# Patient Record
Sex: Female | Born: 1975
Health system: Southern US, Community
[De-identification: ages and names within clinical notes are randomized; demographics above are authoritative.]

## PROBLEM LIST (undated history)

## (undated) DIAGNOSIS — E079 Disorder of thyroid, unspecified: Secondary | ICD-10-CM

## (undated) DIAGNOSIS — Q5128 Other doubling of uterus, other specified: Secondary | ICD-10-CM

## (undated) DIAGNOSIS — Q512 Other doubling of uterus, unspecified: Secondary | ICD-10-CM

## (undated) DIAGNOSIS — Z8619 Personal history of other infectious and parasitic diseases: Secondary | ICD-10-CM

## (undated) DIAGNOSIS — R011 Cardiac murmur, unspecified: Secondary | ICD-10-CM

## (undated) HISTORY — PX: OTHER SURGICAL HISTORY: SHX169

## (undated) HISTORY — DX: Personal history of other infectious and parasitic diseases: Z86.19

## (undated) HISTORY — DX: Cardiac murmur, unspecified: R01.1

## (undated) HISTORY — DX: Disorder of thyroid, unspecified: E07.9

## (undated) HISTORY — DX: Other doubling of uterus, unspecified: Q51.20

## (undated) HISTORY — DX: Other and unspecified doubling of uterus: Q51.28

---
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
## 2001-12-02 ENCOUNTER — Other Ambulatory Visit: Admission: RE | Admit: 2001-12-02 | Discharge: 2001-12-02 | Payer: Self-pay | Admitting: Obstetrics and Gynecology

## 2002-08-19 ENCOUNTER — Emergency Department (HOSPITAL_COMMUNITY): Admission: EM | Admit: 2002-08-19 | Discharge: 2002-08-19 | Payer: Self-pay | Admitting: Emergency Medicine

## 2002-12-08 ENCOUNTER — Other Ambulatory Visit: Admission: RE | Admit: 2002-12-08 | Discharge: 2002-12-08 | Payer: Self-pay | Admitting: Obstetrics and Gynecology

## 2003-08-04 ENCOUNTER — Other Ambulatory Visit: Admission: RE | Admit: 2003-08-04 | Discharge: 2003-08-04 | Payer: Self-pay | Admitting: Obstetrics and Gynecology

## 2004-02-19 ENCOUNTER — Inpatient Hospital Stay (HOSPITAL_COMMUNITY): Admission: AD | Admit: 2004-02-19 | Discharge: 2004-02-23 | Payer: Self-pay | Admitting: Obstetrics and Gynecology

## 2004-02-20 ENCOUNTER — Encounter (INDEPENDENT_AMBULATORY_CARE_PROVIDER_SITE_OTHER): Payer: Self-pay | Admitting: *Deleted

## 2004-02-27 ENCOUNTER — Inpatient Hospital Stay (HOSPITAL_COMMUNITY): Admission: AD | Admit: 2004-02-27 | Discharge: 2004-02-27 | Payer: Self-pay | Admitting: Obstetrics and Gynecology

## 2004-03-01 ENCOUNTER — Ambulatory Visit (HOSPITAL_COMMUNITY): Admission: RE | Admit: 2004-03-01 | Discharge: 2004-03-01 | Payer: Self-pay | Admitting: Obstetrics and Gynecology

## 2004-03-28 ENCOUNTER — Other Ambulatory Visit: Admission: RE | Admit: 2004-03-28 | Discharge: 2004-03-28 | Payer: Self-pay | Admitting: Obstetrics and Gynecology

## 2005-05-25 ENCOUNTER — Other Ambulatory Visit: Admission: RE | Admit: 2005-05-25 | Discharge: 2005-05-25 | Payer: Self-pay | Admitting: Obstetrics and Gynecology

## 2005-07-03 ENCOUNTER — Ambulatory Visit: Payer: Self-pay | Admitting: Family Medicine

## 2006-08-22 ENCOUNTER — Encounter (HOSPITAL_COMMUNITY): Admission: RE | Admit: 2006-08-22 | Discharge: 2006-09-21 | Payer: Self-pay | Admitting: Endocrinology

## 2006-08-27 ENCOUNTER — Encounter (HOSPITAL_COMMUNITY): Admission: RE | Admit: 2006-08-27 | Discharge: 2006-09-26 | Payer: Self-pay | Admitting: Endocrinology

## 2007-05-30 ENCOUNTER — Encounter: Payer: Self-pay | Admitting: Family Medicine

## 2008-06-05 ENCOUNTER — Inpatient Hospital Stay (HOSPITAL_COMMUNITY): Admission: AD | Admit: 2008-06-05 | Discharge: 2008-06-05 | Payer: Self-pay | Admitting: Obstetrics and Gynecology

## 2008-06-08 ENCOUNTER — Inpatient Hospital Stay (HOSPITAL_COMMUNITY): Admission: AD | Admit: 2008-06-08 | Discharge: 2008-06-08 | Payer: Self-pay | Admitting: Obstetrics and Gynecology

## 2008-06-11 ENCOUNTER — Inpatient Hospital Stay (HOSPITAL_COMMUNITY): Admission: AD | Admit: 2008-06-11 | Discharge: 2008-06-11 | Payer: Self-pay | Admitting: Obstetrics and Gynecology

## 2008-06-20 ENCOUNTER — Emergency Department (HOSPITAL_COMMUNITY): Admission: EM | Admit: 2008-06-20 | Discharge: 2008-06-20 | Payer: Self-pay | Admitting: Family Medicine

## 2009-05-26 ENCOUNTER — Inpatient Hospital Stay (HOSPITAL_COMMUNITY): Admission: AD | Admit: 2009-05-26 | Discharge: 2009-05-30 | Payer: Self-pay | Admitting: Obstetrics and Gynecology

## 2009-05-27 ENCOUNTER — Encounter (INDEPENDENT_AMBULATORY_CARE_PROVIDER_SITE_OTHER): Payer: Self-pay | Admitting: Obstetrics and Gynecology

## 2010-06-19 ENCOUNTER — Encounter: Payer: Self-pay | Admitting: Endocrinology

## 2010-06-28 NOTE — Letter (Signed)
Summary: rpc chart  rpc chart   Imported By: Curtis Sites 03/07/2010 11:36:40  _____________________________________________________________________  External Attachment:    Type:   Image     Comment:   External Document

## 2010-08-14 LAB — CBC
HCT: 18.4 % — ABNORMAL LOW (ref 36.0–46.0)
Hemoglobin: 6.4 g/dL — CL (ref 12.0–15.0)
MCHC: 34.6 g/dL (ref 30.0–36.0)
MCV: 100.6 fL — ABNORMAL HIGH (ref 78.0–100.0)
Platelets: 187 10*3/uL (ref 150–400)
RBC: 1.83 MIL/uL — ABNORMAL LOW (ref 3.87–5.11)
RDW: 14.1 % (ref 11.5–15.5)
WBC: 9.5 10*3/uL (ref 4.0–10.5)

## 2010-08-29 LAB — CBC
HCT: 18.1 % — ABNORMAL LOW (ref 36.0–46.0)
HCT: 21 % — ABNORMAL LOW (ref 36.0–46.0)
HCT: 36.3 % (ref 36.0–46.0)
Hemoglobin: 12.6 g/dL (ref 12.0–15.0)
Hemoglobin: 6.4 g/dL — CL (ref 12.0–15.0)
Hemoglobin: 7.2 g/dL — ABNORMAL LOW (ref 12.0–15.0)
MCHC: 34.1 g/dL (ref 30.0–36.0)
MCHC: 34.7 g/dL (ref 30.0–36.0)
MCHC: 35.2 g/dL (ref 30.0–36.0)
MCV: 100 fL (ref 78.0–100.0)
MCV: 101 fL — ABNORMAL HIGH (ref 78.0–100.0)
MCV: 99.5 fL (ref 78.0–100.0)
Platelets: 161 10*3/uL (ref 150–400)
Platelets: 214 10*3/uL (ref 150–400)
Platelets: 234 10*3/uL (ref 150–400)
RBC: 1.82 MIL/uL — ABNORMAL LOW (ref 3.87–5.11)
RBC: 2.08 MIL/uL — ABNORMAL LOW (ref 3.87–5.11)
RBC: 3.65 MIL/uL — ABNORMAL LOW (ref 3.87–5.11)
RDW: 13.4 % (ref 11.5–15.5)
RDW: 13.6 % (ref 11.5–15.5)
RDW: 13.7 % (ref 11.5–15.5)
WBC: 10.7 10*3/uL — ABNORMAL HIGH (ref 4.0–10.5)
WBC: 10.8 10*3/uL — ABNORMAL HIGH (ref 4.0–10.5)
WBC: 14.2 10*3/uL — ABNORMAL HIGH (ref 4.0–10.5)

## 2010-08-29 LAB — RPR: RPR Ser Ql: NONREACTIVE

## 2010-09-12 LAB — DIFFERENTIAL
Basophils Absolute: 0 10*3/uL (ref 0.0–0.1)
Basophils Relative: 0 % (ref 0–1)
Eosinophils Absolute: 0.1 10*3/uL (ref 0.0–0.7)
Eosinophils Relative: 1 % (ref 0–5)
Lymphocytes Relative: 31 % (ref 12–46)
Lymphs Abs: 2.1 10*3/uL (ref 0.7–4.0)
Monocytes Absolute: 0.5 10*3/uL (ref 0.1–1.0)
Monocytes Relative: 7 % (ref 3–12)
Neutro Abs: 4.1 10*3/uL (ref 1.7–7.7)
Neutrophils Relative %: 60 % (ref 43–77)

## 2010-09-12 LAB — AST: AST: 20 U/L (ref 0–37)

## 2010-09-12 LAB — CBC
HCT: 38.6 % (ref 36.0–46.0)
Hemoglobin: 13.3 g/dL (ref 12.0–15.0)
MCHC: 34.5 g/dL (ref 30.0–36.0)
MCV: 94.5 fL (ref 78.0–100.0)
Platelets: 233 10*3/uL (ref 150–400)
RBC: 4.09 MIL/uL (ref 3.87–5.11)
RDW: 11.6 % (ref 11.5–15.5)
WBC: 6.8 10*3/uL (ref 4.0–10.5)

## 2010-09-12 LAB — HCG, QUANTITATIVE, PREGNANCY
hCG, Beta Chain, Quant, S: 158 m[IU]/mL — ABNORMAL HIGH (ref <5)
hCG, Beta Chain, Quant, S: 44 m[IU]/mL — ABNORMAL HIGH (ref <5)
hCG, Beta Chain, Quant, S: 571 m[IU]/mL — ABNORMAL HIGH (ref <5)

## 2010-09-12 LAB — BUN: BUN: 10 mg/dL (ref 6–23)

## 2010-09-12 LAB — CREATININE, SERUM
Creatinine, Ser: 0.68 mg/dL (ref 0.4–1.2)
GFR calc Af Amer: >60 mL/min (ref >60)
GFR calc non Af Amer: >60 mL/min (ref >60)

## 2010-10-14 NOTE — Op Note (Signed)
[name], [name]               ACCOUNT NO.:  [id number]   MEDICAL RECORD NO.:  [id number]          PATIENT TYPE:  INP   LOCATION:  9101                          FACILITY:  [facility]   PHYSICIAN:  Juluis Mire, M.D.   DATE OF BIRTH:  30-Sep-1975   DATE OF PROCEDURE:  02/20/2004  DATE OF DISCHARGE:                                 OPERATIVE REPORT   PREOPERATIVE DIAGNOSES:  1.  Intrauterine pregnancy at term.  2.  Uterus didelphys.  3.  Obstructing band of tissue over the fetal vertex leading to failure to      progress.   POSTOPERATIVE DIAGNOSES:  1.  Intrauterine pregnancy at term.  2.  Uterus didelphys.  3.  Obstructing band of tissue over the fetal vertex leading to failure to      progress.  4.  Cystotomy with repair.   PROCEDURES:  1.  Low transverse cesarean section.  2.  Cystotomy with repair.   SURGEON:  Juluis Mire, M.D.   ANESTHESIA:  Epidural.   ESTIMATED BLOOD LOSS:  400-500 mL.   PACKS AND DRAINS:  None.   INTRAOPERATIVE BLOOD REPLACED:  None.   COMPLICATIONS:  Cystotomy with repair.   INDICATIONS:  The patient is a 35 year old primigravida married white female  with known uterine didelphys.  Had a relatively uncomplicated prenatal  course, presented with spontaneous onset of labor.  Progressed to what we  felt was complete dilatation.  There was an obstructing band of tissue over  the fetal vertex that could not be reduced.  There was concern about  clipping this area not knowing exactly what it was attached to, the decision  therefore was made to proceed with primary cesarean section.  The risks were  discussed, including the risk of infection; the risk of hemorrhage that  could require transfusion; the risk of AIDS or hepatitis; the risk of injury  to adjacent organs including bladder, bowel, or ureters that could require  further exploratory surgery; risk of deep venous thrombosis and pulmonary  embolus.   PROCEDURE:  The patient was taken to the  OR and placed in the supine  position with left lateral tilt.  After a satisfactory level of epidural  anesthesia was obtained, the abdomen was prepped out with Betadine and  draped out in a sterile field.  A low transverse skin incision made with a  knife, carried through subcutaneous tissue.  Anterior rectus fascia was  identified and entered sharply and the incision in the fascia extended  laterally.  The rectus muscles were separated in the midline, the peritoneum  was entered sharply, and the incision in the peritoneum extended both  superiorly and inferiorly.  Visualizing the pelvic cavity, we could identify  the pregnant side of the uterus.  This occupied the majority of the lower  uterine segment.  A low transverse bladder flap seemed to be easily  developed.  A low transverse uterine incision was begun high on the lower  uterine segment.  The infant was delivered with elevation of the head and  fundal pressure.  The infant was a viable  female who weighed 7 pounds 3  ounces, Apgars were 8/9, and the umbilical artery pH was 7.34.  The placenta  was delivered manually.  The uterus was exteriorized for closure.  It  appeared that the pregnancy was in the right uterine horn.  There was a left  uterine cavity, although externally there was not a separation; therefore,  the uterus appeared as one but, again, there were two cavities.  It appeared  that the one on the right was the pregnant one.  We could see a little  septum that had been torn away between the two.  At this point in time we  identified both uterine edges and began closing on the left side.  We  brought this to midline, then stopped.  Then we went to the septum and  attached that; therefore, the left uterine horn continued to have a drainage  system through cervix to the vagina.  Next we started again at the left edge  and completely brought the uterine incision together.  We used a second  stitch, completely imbricating the  first.  With this we had excellent  hemostasis and it appeared adequate approximation of the uterus.  At this  point in time gross hematuria was noted.  Visualizing the bladder, there was  an approximately 3 cm tear at the bladder fundus.  We examined the bladder.  There were no other tears or problems, and this was well from the trigone.  We identified each edge, brought the cystotomy site together with a running  suture of 3-0 chromic.  We then did a second layer of 3-0 chromic  imbricating the first and a third layer using 3-0 Vicryl, imbricating all  the other two layers.  The urine was clearing at this point.  We thoroughly  irrigated the pelvis.  We had good hemostasis.  The uterus was then returned  to the abdominal cavity.  Again tubes and ovaries were unremarkable.  At  this point in time muscles and peritoneum closed in running suture with 3-0  Vicryl, fascia closed with running suture of 3-0 PDS.  The skin was closed  with staples and Steri-Strips.  Sponge, instrument, and needle count  reported as correct by circulating nurse x2.  The Foley catheter was  clearing at the time of closure.  The patient tolerated the procedure well  and was returned to the recovery room in good condition.      JSM/MEDQ  D:  02/20/2004  T:  02/22/2004  Job:  [id number]

## 2010-10-14 NOTE — Discharge Summary (Signed)
[name], [name]               ACCOUNT NO.:  [id number]   MEDICAL RECORD NO.:  [id number]          PATIENT TYPE:  INP   LOCATION:  9101                          FACILITY:  [facility]   PHYSICIAN:  Duke Salvia. Marcelle Overlie, M.D.DATE OF BIRTH:  [birth year]   DATE OF ADMISSION:  02/19/2004  DATE OF DISCHARGE:  02/23/2004                                 DISCHARGE SUMMARY   ADMITTING DIAGNOSES:  1.  Intrauterine pregnancy at term.  2.  Uterine didelphys.  3.  Spontaneous onset of labor.   DISCHARGE DIAGNOSES:  1.  Status post low transverse cesarean section secondary to failure to      progress.  2.  Viable female infant.  3.  Cystotomy with repair.   PROCEDURE:  1.  Primary low transverse cesarean section.  2.  Cystotomy with repair.   REASON FOR ADMISSION:  Please see dictated H&P.   HOSPITAL COURSE:  The patient was a 35 year old primigravida white married  female that was admitted to [facility] with spontaneous  onset of labor.  The patient had known uterine didelphys.  Her pregnancy  course had been relatively uncomplicated.  The patient did progress to  complete dilatation.  There was an obstructing band of tissue that was noted  over the fetal vertex that could not be reduced.  There was some concern  regarding excision of this area not knowing exactly what it was attached to,  and the decision was made to proceed with a primary cesarean delivery.  The  patient was then transferred to the operating room where epidural was dosed  to an adequate surgical level.  A low transverse incision was made with the  delivery of a viable female infant weighing 7 pounds 3 ounces with Apgars of 8  at one minute and 9 at five minutes.  Umbilical cord pH was 7.34.  The  placenta was delivered manually and the uterus was exteriorized for closure.  The pregnancy appeared to be from the right horn of the uterus.  At the time  of the closure, good hemostasis was performed from the uterus  and was noted  to have some hematuria at that time, and the bladder was noted to have a 3  cm tear at the bladder fundus.  Cystotomy site was repaired with 3-0  chromic.  Good hemostasis was obtained.  The uterus was then returned to the  abdominal cavity.  Tubes and ovaries were unremarkable.  Normal closure was  done and the patient was transferred to the recovery room in good condition.  On postoperative day #1, vital signs were stable, she was afebrile.  Fundus  was firm and nontender.  Output was adequate without hematuria.  Labs  revealed hemoglobin of 9.7.  Foley remained in place.  On postoperative day  #2, the patient did complain of passing a large amount of tissue.  Vital  signs were stable, she was afebrile.  Abdomen was soft with good return of  bowel function.  Fundus was firm with small amount of lochia.  Foley  continued with adequate output with clear urine.  Tissue  was sent for  pathology.  On postoperative day #3, the patient was without complaint.  Vital signs were stable, she was afebrile.  Abdomen soft, fundus was firm  and nontender.  Incision was clean, dry, and intact.  Staples were removed.  The Foley was connected to a leg bag and the patient was discharged home.   CONDITION ON DISCHARGE:  Good.   DIET:  Regular as tolerated.   ACTIVITY:  No heavy lifting, no driving x2 weeks, no vaginal entry.   FOLLOW-UP:  The patient is to follow up in the office in 1 week for an  incision check.  She is also to be scheduled for a cystogram in  approximately 10 days.  She is to call for temperature greater than 100  degrees, persistent nausea and vomiting, heavy vaginal bleeding, and/or  redness or drainage from the incisional site.   DISCHARGE MEDICATIONS:  1.  Tylox #30 one p.o. q.4-6h. p.r.n.  2.  Motrin 600 mg q.6h. p.r.n.  3.  Prenatal vitamins one p.o. daily.     Caro   CC/MEDQ  D:  03/15/2004  T:  03/15/2004  Job:  [id number]

## 2011-02-23 ENCOUNTER — Encounter (HOSPITAL_COMMUNITY): Payer: Self-pay | Admitting: Anesthesiology

## 2011-02-23 ENCOUNTER — Inpatient Hospital Stay (HOSPITAL_COMMUNITY): Payer: 59 | Admitting: Anesthesiology

## 2011-02-23 ENCOUNTER — Inpatient Hospital Stay (HOSPITAL_COMMUNITY): Admission: RE | Admit: 2011-02-23 | Payer: 59 | Source: Ambulatory Visit

## 2011-02-23 ENCOUNTER — Encounter (HOSPITAL_COMMUNITY): Payer: Self-pay | Admitting: *Deleted

## 2011-02-23 ENCOUNTER — Inpatient Hospital Stay (HOSPITAL_COMMUNITY)
Admission: AD | Admit: 2011-02-23 | Discharge: 2011-02-25 | DRG: 766 | Disposition: A | Discharge status: Home / Self Care | Payer: 59 | Source: Ambulatory Visit | Attending: Obstetrics and Gynecology | Admitting: Obstetrics and Gynecology

## 2011-02-23 ENCOUNTER — Encounter (HOSPITAL_COMMUNITY)
Admission: AD | Disposition: A | Discharge status: Home / Self Care | Payer: Self-pay | Source: Ambulatory Visit | Attending: Obstetrics and Gynecology

## 2011-02-23 ENCOUNTER — Other Ambulatory Visit: Payer: Self-pay | Admitting: Obstetrics and Gynecology

## 2011-02-23 DIAGNOSIS — O34219 Maternal care for unspecified type scar from previous cesarean delivery: Secondary | ICD-10-CM

## 2011-02-23 DIAGNOSIS — Z9851 Tubal ligation status: Secondary | ICD-10-CM

## 2011-02-23 DIAGNOSIS — O09529 Supervision of elderly multigravida, unspecified trimester: Secondary | ICD-10-CM | POA: Diagnosis present

## 2011-02-23 DIAGNOSIS — Z302 Encounter for sterilization: Secondary | ICD-10-CM

## 2011-02-23 LAB — CBC
HCT: 36.2 % (ref 36.0–46.0)
Hemoglobin: 13 g/dL (ref 12.0–15.0)
MCH: 33.6 pg (ref 26.0–34.0)
MCHC: 35.9 g/dL (ref 30.0–36.0)
MCV: 93.5 fL (ref 78.0–100.0)
Platelets: 217 10*3/uL (ref 150–400)
RBC: 3.87 MIL/uL (ref 3.87–5.11)
RDW: 12.7 % (ref 11.5–15.5)
WBC: 9.6 10*3/uL (ref 4.0–10.5)

## 2011-02-23 LAB — RPR: RPR Ser Ql: NONREACTIVE

## 2011-02-23 LAB — STREP B DNA PROBE: GBS: POSITIVE

## 2011-02-23 SURGERY — Surgical Case
Anesthesia: Spinal | Site: Abdomen | Wound class: Clean Contaminated

## 2011-02-23 MED ORDER — ONDANSETRON HCL 4 MG/2ML IJ SOLN: 4.0000 mg | INTRAMUSCULAR | Status: DC | PRN

## 2011-02-23 MED ORDER — PHENYLEPHRINE 40 MCG/ML (10ML) SYRINGE FOR IV PUSH (FOR BLOOD PRESSURE SUPPORT)
PREFILLED_SYRINGE | INTRAVENOUS | Status: AC
  Filled 2011-02-23: qty 5

## 2011-02-23 MED ORDER — LIOTHYRONINE SODIUM 25 MCG PO TABS
12.5000 ug | ORAL_TABLET | Freq: Every day | ORAL | Status: DC
  Administered 2011-02-23: 25 ug via ORAL
  Administered 2011-02-24: 12.5 ug via ORAL
  Filled 2011-02-23 (×3): qty 1

## 2011-02-23 MED ORDER — OXYTOCIN 10 UNIT/ML IJ SOLN
INTRAMUSCULAR | Status: AC
  Filled 2011-02-23: qty 2

## 2011-02-23 MED ORDER — HYDROMORPHONE HCL 1 MG/ML IJ SOLN: 0.2500 mg | INTRAMUSCULAR | Status: DC | PRN

## 2011-02-23 MED ORDER — CITRIC ACID-SODIUM CITRATE 334-500 MG/5ML PO SOLN: 30.0000 mL | Freq: Once | ORAL | Status: DC

## 2011-02-23 MED ORDER — LEVOTHYROXINE SODIUM 125 MCG PO TABS
125.0000 ug | ORAL_TABLET | Freq: Every day | ORAL | Status: DC
  Administered 2011-02-23 – 2011-02-24 (×2): 125 ug via ORAL
  Filled 2011-02-23 (×3): qty 1

## 2011-02-23 MED ORDER — MEDROXYPROGESTERONE ACETATE 150 MG/ML IM SUSP: 150.0000 mg | INTRAMUSCULAR | Status: DC | PRN

## 2011-02-23 MED ORDER — IBUPROFEN 600 MG PO TABS
600.0000 mg | ORAL_TABLET | Freq: Four times a day (QID) | ORAL | Status: DC
  Administered 2011-02-23 – 2011-02-25 (×7): 600 mg via ORAL
  Filled 2011-02-23 (×3): qty 1

## 2011-02-23 MED ORDER — KETOROLAC TROMETHAMINE 60 MG/2ML IM SOLN
INTRAMUSCULAR | Status: AC
  Filled 2011-02-23: qty 2

## 2011-02-23 MED ORDER — KETOROLAC TROMETHAMINE 60 MG/2ML IM SOLN
60.0000 mg | Freq: Once | INTRAMUSCULAR | Status: AC | PRN
  Administered 2011-02-23: 60 mg via INTRAMUSCULAR

## 2011-02-23 MED ORDER — ONDANSETRON HCL 4 MG PO TABS: 4.0000 mg | ORAL_TABLET | ORAL | Status: DC | PRN

## 2011-02-23 MED ORDER — DIPHENHYDRAMINE HCL 25 MG PO CAPS: 25.0000 mg | ORAL_CAPSULE | ORAL | Status: DC | PRN

## 2011-02-23 MED ORDER — TERBUTALINE SULFATE 1 MG/ML IJ SOLN
0.2500 mg | Freq: Once | INTRAMUSCULAR | Status: AC
  Administered 2011-02-23: 0.25 mg via SUBCUTANEOUS

## 2011-02-23 MED ORDER — LACTATED RINGERS IV SOLN
INTRAVENOUS | Status: DC
  Administered 2011-02-23 (×4): via INTRAVENOUS

## 2011-02-23 MED ORDER — DIPHENHYDRAMINE HCL 50 MG/ML IJ SOLN: 25.0000 mg | INTRAMUSCULAR | Status: DC | PRN

## 2011-02-23 MED ORDER — OXYTOCIN 20 UNITS IN LACTATED RINGERS INFUSION - SIMPLE: 125.0000 mL/h | INTRAVENOUS | Status: AC

## 2011-02-23 MED ORDER — SCOPOLAMINE 1 MG/3DAYS TD PT72
1.0000 | MEDICATED_PATCH | Freq: Once | TRANSDERMAL | Status: DC
  Administered 2011-02-23: 1.5 mg via TRANSDERMAL

## 2011-02-23 MED ORDER — FAMOTIDINE IN NACL 20-0.9 MG/50ML-% IV SOLN: 20.0000 mg | Freq: Once | INTRAVENOUS | Status: DC

## 2011-02-23 MED ORDER — WITCH HAZEL-GLYCERIN EX PADS: 1.0000 "application " | MEDICATED_PAD | CUTANEOUS | Status: DC | PRN

## 2011-02-23 MED ORDER — SENNOSIDES-DOCUSATE SODIUM 8.6-50 MG PO TABS
2.0000 | ORAL_TABLET | Freq: Every day | ORAL | Status: DC
  Administered 2011-02-24: 2 via ORAL

## 2011-02-23 MED ORDER — NALBUPHINE HCL 10 MG/ML IJ SOLN
5.0000 mg | INTRAMUSCULAR | Status: DC | PRN
  Filled 2011-02-23: qty 1

## 2011-02-23 MED ORDER — FENTANYL CITRATE 0.05 MG/ML IJ SOLN
INTRAMUSCULAR | Status: DC | PRN
  Administered 2011-02-23: 87.5 ug via INTRAVENOUS

## 2011-02-23 MED ORDER — KETOROLAC TROMETHAMINE 30 MG/ML IJ SOLN: 30.0000 mg | Freq: Four times a day (QID) | INTRAMUSCULAR | Status: AC | PRN

## 2011-02-23 MED ORDER — OXYCODONE-ACETAMINOPHEN 5-325 MG PO TABS
1.0000 | ORAL_TABLET | ORAL | Status: DC | PRN
  Administered 2011-02-24 – 2011-02-25 (×4): 1 via ORAL
  Filled 2011-02-23 (×4): qty 1

## 2011-02-23 MED ORDER — FENTANYL CITRATE 0.05 MG/ML IJ SOLN
INTRAMUSCULAR | Status: AC
  Filled 2011-02-23: qty 2

## 2011-02-23 MED ORDER — KETOROLAC TROMETHAMINE 30 MG/ML IJ SOLN
30.0000 mg | Freq: Four times a day (QID) | INTRAMUSCULAR | Status: AC | PRN
  Administered 2011-02-23: 30 mg via INTRAMUSCULAR
  Filled 2011-02-23: qty 1

## 2011-02-23 MED ORDER — TETANUS-DIPHTH-ACELL PERTUSSIS 5-2.5-18.5 LF-MCG/0.5 IM SUSP
0.5000 mL | Freq: Once | INTRAMUSCULAR | Status: AC
  Administered 2011-02-24: 0.5 mL via INTRAMUSCULAR
  Filled 2011-02-23: qty 0.5

## 2011-02-23 MED ORDER — ONDANSETRON HCL 4 MG/2ML IJ SOLN: 4.0000 mg | Freq: Three times a day (TID) | INTRAMUSCULAR | Status: DC | PRN

## 2011-02-23 MED ORDER — SODIUM CHLORIDE 0.9 % IJ SOLN: 3.0000 mL | INTRAMUSCULAR | Status: DC | PRN

## 2011-02-23 MED ORDER — SCOPOLAMINE 1 MG/3DAYS TD PT72
MEDICATED_PATCH | TRANSDERMAL | Status: AC
  Filled 2011-02-23: qty 1

## 2011-02-23 MED ORDER — PRENATAL PLUS 27-1 MG PO TABS
1.0000 | ORAL_TABLET | Freq: Every day | ORAL | Status: DC
  Administered 2011-02-23 – 2011-02-25 (×3): 1 via ORAL
  Filled 2011-02-23 (×3): qty 1

## 2011-02-23 MED ORDER — SIMETHICONE 80 MG PO CHEW: 80.0000 mg | CHEWABLE_TABLET | ORAL | Status: DC | PRN

## 2011-02-23 MED ORDER — DEXTROSE IN LACTATED RINGERS 5 % IV SOLN: INTRAVENOUS | Status: DC

## 2011-02-23 MED ORDER — DIBUCAINE 1 % RE OINT: 1.0000 "application " | TOPICAL_OINTMENT | RECTAL | Status: DC | PRN

## 2011-02-23 MED ORDER — NALOXONE HCL 0.4 MG/ML IJ SOLN: 0.4000 mg | INTRAMUSCULAR | Status: DC | PRN

## 2011-02-23 MED ORDER — MORPHINE SULFATE (PF) 0.5 MG/ML IJ SOLN
INTRAMUSCULAR | Status: DC | PRN
  Administered 2011-02-23: .1 ug via INTRATHECAL

## 2011-02-23 MED ORDER — METOCLOPRAMIDE HCL 5 MG/ML IJ SOLN: 10.0000 mg | Freq: Three times a day (TID) | INTRAMUSCULAR | Status: DC | PRN

## 2011-02-23 MED ORDER — MORPHINE SULFATE 0.5 MG/ML IJ SOLN
INTRAMUSCULAR | Status: AC
  Filled 2011-02-23: qty 10

## 2011-02-23 MED ORDER — MEPERIDINE HCL 25 MG/ML IJ SOLN: 6.2500 mg | INTRAMUSCULAR | Status: DC | PRN

## 2011-02-23 MED ORDER — ONDANSETRON HCL 4 MG/2ML IJ SOLN
INTRAMUSCULAR | Status: AC
  Filled 2011-02-23: qty 2

## 2011-02-23 MED ORDER — DIPHENHYDRAMINE HCL 25 MG PO CAPS: 25.0000 mg | ORAL_CAPSULE | Freq: Four times a day (QID) | ORAL | Status: DC | PRN

## 2011-02-23 MED ORDER — MENTHOL 3 MG MT LOZG: 1.0000 | LOZENGE | OROMUCOSAL | Status: DC | PRN

## 2011-02-23 MED ORDER — BUPIVACAINE IN DEXTROSE 0.75-8.25 % IT SOLN
INTRATHECAL | Status: DC | PRN
  Administered 2011-02-23: 1.5 mL via INTRATHECAL

## 2011-02-23 MED ORDER — SIMETHICONE 80 MG PO CHEW
80.0000 mg | CHEWABLE_TABLET | Freq: Three times a day (TID) | ORAL | Status: DC
  Administered 2011-02-23 – 2011-02-25 (×8): 80 mg via ORAL

## 2011-02-23 MED ORDER — ONDANSETRON HCL 4 MG/2ML IJ SOLN
INTRAMUSCULAR | Status: DC | PRN
  Administered 2011-02-23: 4 mg via INTRAVENOUS

## 2011-02-23 MED ORDER — CEFAZOLIN SODIUM-DEXTROSE 1-4 GM-% IV SOLR
1.0000 g | Freq: Once | INTRAVENOUS | Status: AC
  Administered 2011-02-23: 1 g via INTRAVENOUS
  Filled 2011-02-23: qty 50

## 2011-02-23 MED ORDER — FENTANYL CITRATE 0.05 MG/ML IJ SOLN
INTRAMUSCULAR | Status: DC | PRN
  Administered 2011-02-23: 15 ug via INTRATHECAL

## 2011-02-23 MED ORDER — FAMOTIDINE 20 MG PO TABS
20.0000 mg | ORAL_TABLET | Freq: Two times a day (BID) | ORAL | Status: DC
  Filled 2011-02-23: qty 1

## 2011-02-23 MED ORDER — LANOLIN HYDROUS EX OINT: 1.0000 "application " | TOPICAL_OINTMENT | CUTANEOUS | Status: DC | PRN

## 2011-02-23 MED ORDER — IBUPROFEN 600 MG PO TABS
600.0000 mg | ORAL_TABLET | Freq: Four times a day (QID) | ORAL | Status: DC | PRN
  Filled 2011-02-23 (×3): qty 1

## 2011-02-23 MED ORDER — CHLOROPROCAINE HCL 3 % IJ SOLN
INTRAMUSCULAR | Status: AC
  Filled 2011-02-23: qty 20

## 2011-02-23 MED ORDER — SODIUM CHLORIDE 0.9 % IV SOLN
1.0000 ug/kg/h | INTRAVENOUS | Status: DC | PRN
  Filled 2011-02-23: qty 2.5

## 2011-02-23 MED ORDER — DIPHENHYDRAMINE HCL 50 MG/ML IJ SOLN: 12.5000 mg | INTRAMUSCULAR | Status: DC | PRN

## 2011-02-23 MED ORDER — OXYTOCIN 20 UNITS IN LACTATED RINGERS INFUSION - SIMPLE
INTRAVENOUS | Status: DC | PRN
  Administered 2011-02-23 (×2): 20 [IU] via INTRAVENOUS

## 2011-02-23 MED ORDER — TERBUTALINE SULFATE 1 MG/ML IJ SOLN
INTRAMUSCULAR | Status: AC
  Filled 2011-02-23: qty 1

## 2011-02-23 MED ORDER — EPHEDRINE SULFATE 50 MG/ML IJ SOLN
INTRAMUSCULAR | Status: DC | PRN
  Administered 2011-02-23 (×2): 10 mg via INTRAVENOUS
  Administered 2011-02-23: 5 mg via INTRAVENOUS

## 2011-02-23 MED ORDER — MEASLES, MUMPS & RUBELLA VAC ~~LOC~~ INJ
0.5000 mL | INJECTION | Freq: Once | SUBCUTANEOUS | Status: DC
  Filled 2011-02-23: qty 0.5

## 2011-02-23 MED ORDER — CITRIC ACID-SODIUM CITRATE 334-500 MG/5ML PO SOLN
ORAL | Status: AC
  Filled 2011-02-23: qty 15

## 2011-02-23 MED ORDER — CHLOROPROCAINE HCL 3 % IJ SOLN
INTRAMUSCULAR | Status: DC | PRN
  Administered 2011-02-23: 20 mL

## 2011-02-23 MED ORDER — EPHEDRINE 5 MG/ML INJ
INTRAVENOUS | Status: AC
  Filled 2011-02-23: qty 10

## 2011-02-23 SURGICAL SUPPLY — 25 items
CHLORAPREP W/TINT 26ML (MISCELLANEOUS) ×2 IMPLANT
CLOTH BEACON ORANGE TIMEOUT ST (SAFETY) ×2 IMPLANT
DRSG COVADERM 4X8 (GAUZE/BANDAGES/DRESSINGS) ×2 IMPLANT
ELECT REM PT RETURN 9FT ADLT (ELECTROSURGICAL) ×2
ELECTRODE REM PT RTRN 9FT ADLT (ELECTROSURGICAL) ×1 IMPLANT
EXTRACTOR VACUUM M CUP 4 TUBE (SUCTIONS) IMPLANT
GLOVE BIO SURGEON STRL SZ 6.5 (GLOVE) ×2 IMPLANT
GLOVE BIOGEL PI IND STRL 7.0 (GLOVE) ×2 IMPLANT
GLOVE BIOGEL PI INDICATOR 7.0 (GLOVE) ×2
GOWN PREVENTION PLUS LG XLONG (DISPOSABLE) ×6 IMPLANT
KIT ABG SYR 3ML LUER SLIP (SYRINGE) ×2 IMPLANT
NEEDLE HYPO 25X5/8 SAFETYGLIDE (NEEDLE) ×2 IMPLANT
NS IRRIG 1000ML POUR BTL (IV SOLUTION) ×2 IMPLANT
PACK C SECTION WH (CUSTOM PROCEDURE TRAY) ×2 IMPLANT
SLEEVE SCD COMPRESS KNEE MED (MISCELLANEOUS) IMPLANT
STAPLER VISISTAT 35W (STAPLE) IMPLANT
SUT CHROMIC 0 CT 802H (SUTURE) IMPLANT
SUT CHROMIC 0 CTX 36 (SUTURE) ×6 IMPLANT
SUT MNCRL AB 3-0 PS2 27 (SUTURE) IMPLANT
SUT MON AB-0 CT1 36 (SUTURE) ×2 IMPLANT
SUT PDS AB 0 CTX 60 (SUTURE) ×2 IMPLANT
SUT PLAIN 0 NONE (SUTURE) IMPLANT
TOWEL OR 17X24 6PK STRL BLUE (TOWEL DISPOSABLE) ×4 IMPLANT
TRAY FOLEY CATH 14FR (SET/KITS/TRAYS/PACK) IMPLANT
WATER STERILE IRR 1000ML POUR (IV SOLUTION) ×2 IMPLANT

## 2011-02-23 NOTE — Progress Notes (Signed)
PT SAYS SHE WAS IN BED AT 0245   -  SROM- CLEAR.   NOW IN TRIAGE- NO PAD  - NO FLUID- EVEN WITH UC.Marland Kitchen

## 2011-02-23 NOTE — Anesthesia Preprocedure Evaluation (Signed)
Anesthesia Evaluation Anesthesia Physical Anesthesia Plan Anesthesia Quick Evaluation  

## 2011-02-23 NOTE — Anesthesia Postprocedure Evaluation (Signed)
Anesthesia Post Note  Patient: [name]  Procedure(s) Performed:  CESAREAN SECTION  Anesthesia type: Spinal  Patient location: PACU  Post pain: Pain level controlled  Post assessment: Post-op Vital signs reviewed  Last Vitals:  Filed Vitals:   02/23/11 0645  BP: 96/62  Pulse: 96  Temp: 97.5 F (36.4 C)  Resp: 25    Post vital signs: Reviewed  Level of consciousness: awake  Complications: No apparent anesthesia complications

## 2011-02-23 NOTE — H&P (Signed)
34yo G4P2 @ 38+3 wks presents w/ ROM and regular, painful ctx.  Pregnancy uncomplicated.  Past history - See hollister   Prior c-section x 2 w/ cystotomy   Uterine didelphys  AF, VSS Gen - NAD Abd - gravid, NT PV - + pool, + ROM  A/P:  Plan for repeat c-section w/ BTL R/B/A d/w pt, informed consent obtained

## 2011-02-23 NOTE — Transfer of Care (Signed)
Immediate Anesthesia Transfer of Care Note  Patient: [name]  Procedure(s) Performed:  CESAREAN SECTION  Patient Location: PACU  Anesthesia Type: Spinal  Level of Consciousness: awake, alert  and oriented  Airway & Oxygen Therapy: Patient Spontanous Breathing  Post-op Assessment: Report given to PACU RN and Post -op Vital signs reviewed and stable  Post vital signs: Reviewed and stable  Complications: No apparent anesthesia complications

## 2011-02-23 NOTE — Progress Notes (Signed)
2 CM ON TUESDAY

## 2011-02-23 NOTE — Anesthesia Procedure Notes (Signed)
Spinal Block  Patient location during procedure: OR Start time: 02/23/2011 4:38 AM Staffing Performed by: anesthesiologist  Preanesthetic Checklist Completed: patient identified, site marked, surgical consent, pre-op evaluation, timeout performed, IV checked, risks and benefits discussed and monitors and equipment checked Spinal Block Patient position: sitting Prep: DuraPrep Patient monitoring: heart rate, cardiac monitor, continuous pulse ox and blood pressure Approach: midline Location: L3-4 Injection technique: single-shot Needle Needle type: Sprotte  Needle gauge: 24 G Needle length: 9 cm Assessment Sensory level: T4

## 2011-02-23 NOTE — Progress Notes (Addendum)
Cesarean Section Procedure Note   [name]  02/23/2011  Indications: ROM, prior c-section x 2   Pre-operative Diagnosis: Previous C-Section; Laboring.   Post-operative Diagnosis: Same   Procedure:  Repeat LTCD, BTL  Surgeon: Surgeon(s) and Role:    * Zelphia Cairo - Primary   Assistants: none  Anesthesia: spinal   Procedure Details:  The patient was seen in the Holding Room. The risks, benefits, complications, treatment options, and expected outcomes were discussed with the patient. The patient concurred with the proposed plan, giving informed consent. identified as Durenda Age and the procedure verified as C-Section Delivery. A Time Out was held and the above information confirmed.  After induction of anesthesia, the patient was draped and prepped in the usual sterile manner. A transverse was made and carried down through the subcutaneous tissue to the fascia. Fascial incision was made and extended transversely. The fascia was separated from the underlying rectus tissue superiorly and inferiorly. The peritoneum was identified and entered. Peritoneal incision was extended longitudinally. The utero-vesical peritoneal reflection was incised transversely and the bladder flap was bluntly freed from the lower uterine segment. A low transverse uterine incision was made. Delivered from breech (footling) presentation was a viable female infant with Apgar scores of 8 at one minute and 8 at five minutes. Cord ph was sent the umbilical cord was clamped and cut cord blood was obtained for evaluation. The placenta was removed Intact and appeared normal. The uterine outline, tubes and ovaries appeared abnormal - uterine didelphys}. The uterine incision was closed with running locked sutures of 0chromic gut.   Hemostasis was observed. Lavage was carried out until clear.  Left fallopian tube was grasped with a babcock clamp and the mesosalpinx was incised.  Plain gut suture was used to doubly tie  off a knuckle of fallopian tube.  This area of the tube was then excised and passed off to be sent to pathology.  Procedure was repeated on the right fallopian tube.  Hemostasis was observed bilaterally.  Pelvis was irrigated and all incisions were re-inspected and found to be hemostatic. The fascia was then reapproximated with running sutures of 0PDS.  The skin was closed with staples.   Instrument, sponge, and needle counts were correct prior the abdominal closure and were correct at the conclusion of the case.    Estimated Blood Loss: 500cc   Urine Output: clear  Specimens:  Specimens    None       Complications: no complications  Disposition: PACU - hemodynamically stable.   Maternal Condition: stable   Baby condition / location:  nursery-stable  Attending Attestation: I was present and scrubbed for the entire procedure.   Signed: Surgeon(s): Zelphia Cairo

## 2011-02-23 NOTE — Anesthesia Preprocedure Evaluation (Signed)
Anesthesia Evaluation  Name, MR# and DOB Patient awake  General Assessment Comment  Reviewed: Allergy & Precautions, H&P , NPO status , Patient's Chart, lab work & pertinent test results, reviewed documented beta blocker date and time   History of Anesthesia Complications Negative for: history of anesthetic complications  Airway Mallampati: II TM Distance: >3 FB Neck ROM: full    Dental  (+) Teeth Intact   Pulmonary  clear to auscultation  breath sounds clear to auscultation none    Cardiovascular regular Normal    Neuro/Psych Negative Neurological ROS  Negative Psych ROS  GI/Hepatic/Renal   negative Liver ROS  negative Renal ROS   GERD Medicated     Endo/Other  (+) Hypothyroidism,      Abdominal   Musculoskeletal   Hematology negative hematology ROS (+)   Peds  Reproductive/Obstetrics (+) Pregnancy    Anesthesia Other Findings             Anesthesia Physical Anesthesia Plan  ASA: II and Emergent  Anesthesia Plan: Spinal   Post-op Pain Management:    Induction:   Airway Management Planned:   Additional Equipment:   Intra-op Plan:   Post-operative Plan:   Informed Consent: I have reviewed the patients History and Physical, chart, labs and discussed the procedure including the risks, benefits and alternatives for the proposed anesthesia with the patient or authorized representative who has indicated his/her understanding and acceptance.     Plan Discussed with: CRNA and Surgeon  Anesthesia Plan Comments:         Anesthesia Quick Evaluation

## 2011-02-24 LAB — CBC
MCH: 33.1 pg (ref 26.0–34.0)
MCHC: 34.6 g/dL (ref 30.0–36.0)
MCV: 95.6 fL (ref 78.0–100.0)
Platelets: 181 10*3/uL (ref 150–400)
RDW: 13.1 % (ref 11.5–15.5)

## 2011-02-24 MED ORDER — INFLUENZA VIRUS VACC SPLIT PF IM SUSP
0.5000 mL | Freq: Once | INTRAMUSCULAR | Status: AC
  Administered 2011-02-24: 0.5 mL via INTRAMUSCULAR
  Filled 2011-02-24: qty 0.5

## 2011-02-24 NOTE — Progress Notes (Signed)
Subjective: Postpartum Day one: Cesarean Delivery Patient reports tolerating PO.    Objective: Vital signs in last 24 hours: Temp:  [97.7 F (36.5 C)-98.5 F (36.9 C)] 97.8 F (36.6 C) (09/28 0400) Pulse Rate:  [62-81] 70  (09/28 0400) Resp:  [16-20] 18  (09/28 0400) BP: (84-118)/(48-70) 84/53 mmHg (09/28 0400) SpO2:  [98 %-100 %] 99 % (09/28 0400)  Physical Exam:  General: alert Lochia: appropriate Uterine Fundus: firm Incision: dressing in place no active bleeding noted DVT Evaluation: No evidence of DVT seen on physical exam.   Basename 02/24/11 0605 02/23/11 0400  HGB 9.0* 13.0  HCT 26.0* 36.2    Assessment/Plan: Status post Cesarean section. Doing well postoperatively.  Continue current care.  [name] S 02/24/2011, 8:24 AM

## 2011-02-24 NOTE — Anesthesia Postprocedure Evaluation (Signed)
  Anesthesia Post-op Note  Patient: [name]  Procedure(s) Performed:  CESAREAN SECTION  Patient Location: Mother/Baby  Anesthesia Type: Spinal  Level of Consciousness: awake, alert , oriented and patient cooperative  Airway and Oxygen Therapy: Patient Spontanous Breathing  Post-op Pain: none  Post-op Assessment: Post-op Vital signs reviewed, Patient's Cardiovascular Status Stable, Respiratory Function Stable, Patent Airway, No signs of Nausea or vomiting, Adequate PO intake and Pain level controlled  Post-op Vital Signs: Reviewed and stable  Complications: No apparent anesthesia complications

## 2011-02-25 MED ORDER — OXYCODONE-ACETAMINOPHEN 5-500 MG PO CAPS: 1.0000 | ORAL_CAPSULE | ORAL | Status: AC | PRN

## 2011-02-25 NOTE — Discharge Summary (Signed)
Obstetric Discharge Summary Reason for Admission: onset of labor prior cesarean section for repeat desires sterility Prenatal Procedures: none Intrapartum Procedures: cesarean: low cervical, transverse and btl Postpartum Procedures: none Complications-Operative and Postpartum: none Hemoglobin  Date Value Range Status  02/24/2011 9.0* 12.0-15.0 (g/dL) Final     DELTA CHECK NOTED     REPEATED TO VERIFY     HCT  Date Value Range Status  02/24/2011 26.0* 36.0-46.0 (%) Final    Discharge Diagnoses: Term Pregnancy-delivered  Discharge Information: Date: 02/25/2011 Activity: pelvic rest Diet: routine Medications: Percocet Condition: stable Instructions: refer to practice specific booklet Discharge to: home   Newborn Data: Live born female  Birth Weight: 7 lb 5.3 oz (3325 g) APGAR: 8, 9  Home with mother.  Kanoe Wanner S 02/25/2011, 9:06 AM

## 2011-02-25 NOTE — Progress Notes (Signed)
Subjective: Postpartum Day two: Cesarean Delivery Patient reports + flatus.    Objective: Vital signs in last 24 hours: Temp:  [97.7 F (36.5 C)-98.3 F (36.8 C)] 97.8 F (36.6 C) (09/29 0550) Pulse Rate:  [66-76] 76  (09/29 0550) Resp:  [18] 18  (09/29 0550) BP: (89-95)/(46-59) 94/58 mmHg (09/29 0550) SpO2:  [99 %] 99 % (09/28 0935)  Physical Exam:  General: alert Lochia: appropriate Uterine Fundus: firm Incision: healing well DVT Evaluation: No evidence of DVT seen on physical exam.   Basename 02/24/11 0605 02/23/11 0400  HGB 9.0* 13.0  HCT 26.0* 36.2    Assessment/Plan: Status post Cesarean section. Doing well postoperatively.  Discharge home with standard precautions and return to clinic in 4-6 weeks.  [name] S 02/25/2011, 9:05 AM

## 2011-02-27 ENCOUNTER — Encounter (HOSPITAL_COMMUNITY): Payer: Self-pay | Admitting: Obstetrics and Gynecology

## 2011-02-28 ENCOUNTER — Encounter (HOSPITAL_COMMUNITY): Admission: RE | Payer: Self-pay | Source: Ambulatory Visit

## 2011-02-28 ENCOUNTER — Inpatient Hospital Stay (HOSPITAL_COMMUNITY): Admission: RE | Admit: 2011-02-28 | Payer: 59 | Source: Ambulatory Visit | Admitting: Obstetrics and Gynecology

## 2011-02-28 SURGERY — Surgical Case
Anesthesia: Spinal

## 2011-03-17 ENCOUNTER — Encounter (HOSPITAL_COMMUNITY): Payer: Self-pay | Admitting: *Deleted

## 2014-03-30 ENCOUNTER — Encounter (HOSPITAL_COMMUNITY): Payer: Self-pay | Admitting: *Deleted

## 2016-10-30 LAB — HM PAP SMEAR: HM PAP: NORMAL

## 2016-10-31 LAB — HM MAMMOGRAPHY: HM MAMMO: ABNORMAL — AB (ref 0–4)

## 2016-11-01 ENCOUNTER — Other Ambulatory Visit: Payer: Self-pay | Admitting: Obstetrics and Gynecology

## 2016-11-01 DIAGNOSIS — R928 Other abnormal and inconclusive findings on diagnostic imaging of breast: Secondary | ICD-10-CM

## 2016-11-03 ENCOUNTER — Ambulatory Visit
Admission: RE | Admit: 2016-11-03 | Discharge: 2016-11-03 | Disposition: A | Discharge status: Home / Self Care | Payer: BLUE CROSS/BLUE SHIELD | Source: Ambulatory Visit | Attending: Obstetrics and Gynecology | Admitting: Obstetrics and Gynecology

## 2016-11-03 DIAGNOSIS — R928 Other abnormal and inconclusive findings on diagnostic imaging of breast: Secondary | ICD-10-CM

## 2016-11-03 LAB — HM MAMMOGRAPHY

## 2017-05-14 ENCOUNTER — Encounter: Payer: Self-pay | Admitting: Physician Assistant

## 2017-05-14 ENCOUNTER — Ambulatory Visit (INDEPENDENT_AMBULATORY_CARE_PROVIDER_SITE_OTHER): Payer: BLUE CROSS/BLUE SHIELD | Admitting: Physician Assistant

## 2017-05-14 ENCOUNTER — Other Ambulatory Visit: Payer: Self-pay

## 2017-05-14 VITALS — BP 98/70 | HR 61 | Temp 97.1°F | Resp 12 | Ht 63.0 in | Wt 114.0 lb

## 2017-05-14 DIAGNOSIS — E039 Hypothyroidism, unspecified: Secondary | ICD-10-CM

## 2017-05-14 DIAGNOSIS — L989 Disorder of the skin and subcutaneous tissue, unspecified: Secondary | ICD-10-CM

## 2017-05-14 LAB — TSH: TSH: 2.08 u[IU]/mL (ref 0.35–4.50)

## 2017-05-14 NOTE — Assessment & Plan Note (Signed)
Nodular lesion. Seems benign. Referral to Dermatology placed for excision.

## 2017-05-14 NOTE — Progress Notes (Signed)
Patient presents to clinic today to establish care.  Acute Concerns: Patient endorses a spot of forehead that has been present for several years. Has been changing in size over the past couple of weeks. Denies pain, pruritus, drainage. Denies personal history of skin cancer. Mother with history of BCC.   Chronic Issues: Hypothyroidism -- Followed by Dr. Sharl MaKerr (Endocrinology). Is currently on a regimen of levothyroxine 112mcg daily and liothyronine 25 mcg daily. Has been stable on this regimen for a while. Was unable to follow-up wiuth Dr. Sharl MaKerr at last scheduled time. Would like labs today if possible.  Health Maintenance: Immunizations -- immunizations are up-to-date Mammogram -- August in 12/2016. No concerning findings.  PAP -- August 2018. Negative PAP smear per patient.   Past Medical History:  Diagnosis Date  . Heart murmur   . History of chickenpox   . Thyroid disease     Past Surgical History:  Procedure Laterality Date  . CESAREAN SECTION  02/23/2011   Procedure: CESAREAN SECTION;  Surgeon: Zelphia CairoGretchen Adkins;  Location: [facility];  Service: Gynecology;  Laterality: N/A;  . thyroid radiation  2007/2008  . TONSILLECTOMY AND ADENOIDECTOMY  1982    Current Outpatient Medications on File Prior to Visit  Medication Sig Dispense Refill  . levothyroxine (SYNTHROID, LEVOTHROID) 112 MCG tablet Take 1 tablet by mouth daily.    Marland Kitchen. liothyronine (CYTOMEL) 25 MCG tablet Take 12.5 mcg by mouth daily.       No current facility-administered medications on file prior to visit.     No Known Allergies  Family History  Problem Relation Age of Onset  . Alcohol abuse Mother   . Cancer Mother        Skin  . Depression Mother   . Arthritis Father   . Hypertension Sister   . Cancer Maternal Grandmother 60       Colon  . Hyperlipidemia Maternal Grandmother   . Hypertension Maternal Grandmother   . Stroke Maternal Grandmother   . Cancer Maternal Grandfather 1770       Colon, Lung  . COPD  Maternal Grandfather   . Stroke Maternal Grandfather   . Arthritis Paternal Grandmother   . Alcohol abuse Paternal Grandfather   . Arthritis Paternal Grandfather     Social History   Socioeconomic History  . Marital status: Married    Spouse name: Not on file  . Number of children: 3  . Years of education: Not on file  . Highest education level: Not on file  Social Needs  . Financial resource strain: Not on file  . Food insecurity - worry: Not on file  . Food insecurity - inability: Not on file  . Transportation needs - medical: Not on file  . Transportation needs - non-medical: Not on file  Occupational History  . Not on file  Tobacco Use  . Smoking status: Never Smoker  . Smokeless tobacco: Never Used  Substance and Sexual Activity  . Alcohol use: Yes    Comment: 2-3 per week on occasion  . Drug use: No  . Sexual activity: Yes  Other Topics Concern  . Not on file  Social History Narrative  . Not on file   Review of Systems  Constitutional: Negative for chills and fever.  Respiratory: Negative for shortness of breath.   Cardiovascular: Negative for chest pain and palpitations.  Skin: Negative for itching and rash.   BP 98/70   Pulse 61   Temp (!) 97.1 F (36.2 C) (Oral)  Resp 12   Ht 5\' 3"  (1.6 m)   Wt 114 lb (51.7 kg)   LMP 05/11/2017   SpO2 99%   Breastfeeding? No   BMI 20.19 kg/m   Physical Exam  Constitutional: She is oriented to person, place, and time and well-developed, well-nourished, and in no distress.  HENT:  Head: Normocephalic and atraumatic.  Left Ear: External ear normal.  Eyes: Conjunctivae are normal.  Neck: Neck supple. No thyromegaly present.  Cardiovascular: Normal rate, regular rhythm, normal heart sounds and intact distal pulses.  Pulmonary/Chest: Effort normal and breath sounds normal. No respiratory distress. She has no wheezes. She has no rales. She exhibits no tenderness.  Neurological: She is alert and oriented to person,  place, and time.  Skin: Skin is warm and dry. No rash noted.     Psychiatric: Affect normal.  Vitals reviewed.  Assessment/Plan: Hypothyroidism Repeat TSH levels today. Will alter regimen accordingly.  Benign skin lesion of forehead Nodular lesion. Seems benign. Referral to Dermatology placed for excision.    Piedad ClimesWilliam Cody Demoni Parmar, PA-C

## 2017-05-14 NOTE — Assessment & Plan Note (Signed)
Repeat TSH levels today. Will alter regimen accordingly.

## 2017-05-14 NOTE — Patient Instructions (Signed)
Please go to the lab today for blood work.  I will call you with your results. We will alter treatment regimen(s) if indicated by your results.   You will be contacted by Dermatology for removal of the lesion on the forehead.  If you do not hear from them within a week, let me know.  Please schedule a complete physical at your earliest convenience.

## 2017-06-25 NOTE — Progress Notes (Deleted)
Patient presents to clinic today for annual exam.  Patient is fasting for labs.  Acute Concerns:   Chronic Issues: Hypothyroidism -- s/p radiation. Is currently on a regimen of levothyroxine 112 mcg and liothyronine 12.5 mcg daily. *** taking medications as directed. ***.  Health Maintenance: Immunizations -- up-to-date. Declines flu. Mammogram -- *** PAP -- up-to-date  Past Medical History:  Diagnosis Date  . Heart murmur   . History of chickenpox   . Thyroid disease   . Uterus didelphus     Past Surgical History:  Procedure Laterality Date  . CESAREAN SECTION  02/23/2011   Procedure: CESAREAN SECTION;  Surgeon: Zelphia CairoGretchen Adkins;  Location: [facility];  Service: Gynecology;  Laterality: N/A;  . thyroid radiation  2007/2008  . TONSILLECTOMY AND ADENOIDECTOMY  1982    Current Outpatient Medications on File Prior to Visit  Medication Sig Dispense Refill  . levothyroxine (SYNTHROID, LEVOTHROID) 112 MCG tablet Take 1 tablet by mouth daily.    Marland Kitchen. liothyronine (CYTOMEL) 25 MCG tablet Take 12.5 mcg by mouth daily.       No current facility-administered medications on file prior to visit.     No Known Allergies  Family History  Problem Relation Age of Onset  . Alcohol abuse Mother   . Cancer Mother        Skin  . Depression Mother   . Arthritis Father   . Hypertension Sister   . Cancer Maternal Grandmother 60       Colon  . Hyperlipidemia Maternal Grandmother   . Hypertension Maternal Grandmother   . Stroke Maternal Grandmother   . Cancer Maternal Grandfather [id number]       Colon, Lung  . COPD Maternal Grandfather   . Stroke Maternal Grandfather   . Arthritis Paternal Grandmother   . Alcohol abuse Paternal Grandfather   . Arthritis Paternal Grandfather     Social History   Socioeconomic History  . Marital status: Married    Spouse name: Not on file  . Number of children: 3  . Years of education: Not on file  . Highest education level: Not on file  Social Needs    . Financial resource strain: Not on file  . Food insecurity - worry: Not on file  . Food insecurity - inability: Not on file  . Transportation needs - medical: Not on file  . Transportation needs - non-medical: Not on file  Occupational History  . Not on file  Tobacco Use  . Smoking status: Never Smoker  . Smokeless tobacco: Never Used  Substance and Sexual Activity  . Alcohol use: Yes    Comment: 2-3 per week on occasion  . Drug use: No  . Sexual activity: Yes  Other Topics Concern  . Not on file  Social History Narrative  . Not on file   Review of Systems  Constitutional: Negative for fever and weight loss.  HENT: Negative for ear discharge, ear pain, hearing loss and tinnitus.   Eyes: Negative for blurred vision, double vision, photophobia and pain.  Respiratory: Negative for cough and shortness of breath.   Cardiovascular: Negative for chest pain and palpitations.  Gastrointestinal: Negative for abdominal pain, blood in stool, constipation, diarrhea, heartburn, melena, nausea and vomiting.  Genitourinary: Negative for dysuria, flank pain, frequency, hematuria and urgency.  Musculoskeletal: Negative for falls.  Neurological: Negative for dizziness, loss of consciousness and headaches.  Endo/Heme/Allergies: Negative for environmental allergies.  Psychiatric/Behavioral: Negative for depression, hallucinations, substance abuse and suicidal ideas. The  patient is not nervous/anxious and does not have insomnia.    There were no vitals taken for this visit.  Physical Exam  Constitutional: She is oriented to person, place, and time and well-developed, well-nourished, and in no distress.  HENT:  Head: Normocephalic and atraumatic.  Right Ear: Tympanic membrane, external ear and ear canal normal.  Left Ear: Tympanic membrane, external ear and ear canal normal.  Nose: Nose normal. No mucosal edema.  Mouth/Throat: Uvula is midline, oropharynx is clear and moist and mucous membranes  are normal. No oropharyngeal exudate or posterior oropharyngeal erythema.  Eyes: Conjunctivae are normal. Pupils are equal, round, and reactive to light.  Neck: Neck supple. No thyromegaly present.  Cardiovascular: Normal rate, regular rhythm, normal heart sounds and intact distal pulses.  Pulmonary/Chest: Effort normal and breath sounds normal. No respiratory distress. She has no wheezes. She has no rales.  Abdominal: Soft. Bowel sounds are normal. She exhibits no distension and no mass. There is no tenderness. There is no rebound and no guarding.  Lymphadenopathy:    She has no cervical adenopathy.  Neurological: She is alert and oriented to person, place, and time. No cranial nerve deficit.  Skin: Skin is warm and dry. No rash noted.  Psychiatric: Affect normal.  Vitals reviewed.   Recent Results (from the past 2160 hour(s))  TSH     Status: None   Collection Time: 05/14/17 11:26 AM  Result Value Ref Range   TSH 2.08 0.35 - 4.50 uIU/mL    Assessment/Plan: No problem-specific Assessment & Plan notes found for this encounter.    Piedad Climes, PA-C

## 2017-06-26 ENCOUNTER — Encounter: Payer: BLUE CROSS/BLUE SHIELD | Admitting: Physician Assistant

## 2017-06-26 DIAGNOSIS — Z0289 Encounter for other administrative examinations: Secondary | ICD-10-CM

## 2017-08-03 MED FILL — SYNTHROID 112 MCG TABLET: 112 | 60 days supply | Qty: 60 | Fill #0

## 2017-10-16 ENCOUNTER — Telehealth: Payer: Self-pay | Admitting: Physician Assistant

## 2017-10-16 NOTE — Telephone Encounter (Signed)
Copied from CRM [id number]. Topic: Quick Communication - Rx Refill/Question >> Oct 16, 2017  1:32 PM Mickel Baas B, NT wrote: Medication: liothyronine (CYTOMEL) 25 MCG tablet levothyroxine (SYNTHROID, LEVOTHROID) 112 MCG tablet  Has the patient contacted their pharmacy? Yes.   (Agent: If no, request that the patient contact the pharmacy for the refill.) (Agent: If yes, when and what did the pharmacy advise?)  Preferred Pharmacy (with phone number or street name): [facility] - [place], [place] - [address]  Agent: Please be advised that RX refills may take up to 3 business days. We ask that you follow-up with your pharmacy.

## 2017-10-17 ENCOUNTER — Ambulatory Visit (INDEPENDENT_AMBULATORY_CARE_PROVIDER_SITE_OTHER): Payer: No Typology Code available for payment source | Admitting: Physician Assistant

## 2017-10-17 ENCOUNTER — Other Ambulatory Visit: Payer: Self-pay

## 2017-10-17 ENCOUNTER — Encounter: Payer: Self-pay | Admitting: Physician Assistant

## 2017-10-17 VITALS — BP 92/64 | HR 65 | Temp 98.0°F | Resp 14 | Ht 63.0 in | Wt 114.0 lb

## 2017-10-17 DIAGNOSIS — Z0000 Encounter for general adult medical examination without abnormal findings: Secondary | ICD-10-CM | POA: Diagnosis not present

## 2017-10-17 DIAGNOSIS — E039 Hypothyroidism, unspecified: Secondary | ICD-10-CM | POA: Diagnosis not present

## 2017-10-17 DIAGNOSIS — Z7689 Persons encountering health services in other specified circumstances: Secondary | ICD-10-CM

## 2017-10-17 LAB — CBC WITH DIFFERENTIAL/PLATELET
BASOS ABS: 0 10*3/uL (ref 0.0–0.1)
Basophils Relative: 0.5 % (ref 0.0–3.0)
EOS ABS: 0.1 10*3/uL (ref 0.0–0.7)
Eosinophils Relative: 1 % (ref 0.0–5.0)
HCT: 41 % (ref 36.0–46.0)
Hemoglobin: 14.1 g/dL (ref 12.0–15.0)
Lymphocytes Relative: 29 % (ref 12.0–46.0)
Lymphs Abs: 1.6 10*3/uL (ref 0.7–4.0)
MCHC: 34.4 g/dL (ref 30.0–36.0)
MCV: 96.6 fl (ref 78.0–100.0)
MONOS PCT: 6 % (ref 3.0–12.0)
Monocytes Absolute: 0.3 10*3/uL (ref 0.1–1.0)
NEUTROS ABS: 3.6 10*3/uL (ref 1.4–7.7)
NEUTROS PCT: 63.5 % (ref 43.0–77.0)
PLATELETS: 222 10*3/uL (ref 150.0–400.0)
RBC: 4.24 Mil/uL (ref 3.87–5.11)
RDW: 12.5 % (ref 11.5–15.5)
WBC: 5.7 10*3/uL (ref 4.0–10.5)

## 2017-10-17 LAB — LIPID PANEL
CHOL/HDL RATIO: 2
Cholesterol: 188 mg/dL (ref 0–200)
HDL: 87.6 mg/dL (ref >39.00)
LDL CALC: 86 mg/dL (ref 0–99)
NonHDL: 100.62
TRIGLYCERIDES: 74 mg/dL (ref 0.0–149.0)
VLDL: 14.8 mg/dL (ref 0.0–40.0)

## 2017-10-17 LAB — COMPREHENSIVE METABOLIC PANEL
ALT: 9 U/L (ref 0–35)
AST: 12 U/L (ref 0–37)
Albumin: 4.6 g/dL (ref 3.5–5.2)
Alkaline Phosphatase: 59 U/L (ref 39–117)
BILIRUBIN TOTAL: 0.6 mg/dL (ref 0.2–1.2)
BUN: 11 mg/dL (ref 6–23)
CO2: 27 mEq/L (ref 19–32)
Calcium: 9.5 mg/dL (ref 8.4–10.5)
Chloride: 103 mEq/L (ref 96–112)
Creatinine, Ser: 0.86 mg/dL (ref 0.40–1.20)
GFR: 76.34 mL/min (ref >60.00)
GLUCOSE: 89 mg/dL (ref 70–99)
Potassium: 4.2 mEq/L (ref 3.5–5.1)
Sodium: 138 mEq/L (ref 135–145)
Total Protein: 7.4 g/dL (ref 6.0–8.3)

## 2017-10-17 LAB — TSH: TSH: 11.38 u[IU]/mL — ABNORMAL HIGH (ref 0.35–4.50)

## 2017-10-17 LAB — HEMOGLOBIN A1C: Hgb A1c MFr Bld: 5 % (ref 4.6–6.5)

## 2017-10-17 MED ORDER — LIOTHYRONINE SODIUM 25 MCG PO TABS: 12.5000 ug | ORAL_TABLET | Freq: Every day | ORAL | 3 refills | Status: DC

## 2017-10-17 MED ORDER — LEVOTHYROXINE SODIUM 112 MCG PO TABS: 112.0000 ug | ORAL_TABLET | Freq: Every day | ORAL | 3 refills | Status: DC

## 2017-10-17 MED FILL — LIOTHYRONINE SODIUM 25 MCG: 25 | 90 days supply | Qty: 45 | Fill #0

## 2017-10-17 NOTE — Telephone Encounter (Signed)
[name] has reordered these during her visit today (10/17/17)   Levothyroxine and Liothyronine.

## 2017-10-17 NOTE — Progress Notes (Signed)
Patient presents to clinic today for annual exam.  Patient is fasting for labs.  Diet -- Endorses very well-balanced. Is working on water intake.  Exercise -- Is working on increasing the amount of exercise she gets in weekly.   Acute Concerns: Denies acute concerns today.   Chronic Issues: Hypothyroidism -- Currently on a regimen of levothyroxine 112 mcg daily and liothyronine 25 mcg daily. Is taking medications as directed and tolerating well.  Due for repeat TSH levels today. Patient has been previously well-controlled on this regimen.   Lab Results  Component Value Date   TSH 2.08 05/14/2017   Health Maintenance: Immunizations -- Up-to-date.  Mammogram -- June 2018. Abnormal screening -- repeat testing (diagnostic) revealing no concerns.  PAP -- Last year. Normal per patient. Has been followed by GYN (Dr. Rana Snare -- [facility]). Is having to change her provider due to insurance. Needs new referral to Dr. Marice Potter.   Past Medical History:  Diagnosis Date  . Heart murmur   . History of chickenpox   . Thyroid disease   . Uterus didelphus     Past Surgical History:  Procedure Laterality Date  . CESAREAN SECTION  02/23/2011   Procedure: CESAREAN SECTION;  Surgeon: Zelphia Cairo;  Location: [facility];  Service: Gynecology;  Laterality: N/A;  . thyroid radiation  2007/2008  . TONSILLECTOMY AND ADENOIDECTOMY  1982    No current outpatient medications on file prior to visit.   No current facility-administered medications on file prior to visit.     No Known Allergies  Family History  Problem Relation Age of Onset  . Alcohol abuse Mother   . Cancer Mother        Skin  . Depression Mother   . Arthritis Father   . Hypertension Sister   . Cancer Maternal Grandmother 60       Colon  . Hyperlipidemia Maternal Grandmother   . Hypertension Maternal Grandmother   . Stroke Maternal Grandmother   . Cancer Maternal Grandfather 30       Colon, Lung  . COPD Maternal  Grandfather   . Stroke Maternal Grandfather   . Arthritis Paternal Grandmother   . Alcohol abuse Paternal Grandfather   . Arthritis Paternal Grandfather     Social History   Socioeconomic History  . Marital status: Married    Spouse name: Not on file  . Number of children: 3  . Years of education: Not on file  . Highest education level: Not on file  Occupational History  . Not on file  Social Needs  . Financial resource strain: Not on file  . Food insecurity:    Worry: Not on file    Inability: Not on file  . Transportation needs:    Medical: Not on file    Non-medical: Not on file  Tobacco Use  . Smoking status: Never Smoker  . Smokeless tobacco: Never Used  Substance and Sexual Activity  . Alcohol use: Yes    Comment: 2-3 per week on occasion  . Drug use: No  . Sexual activity: Yes  Lifestyle  . Physical activity:    Days per week: Not on file    Minutes per session: Not on file  . Stress: Not on file  Relationships  . Social connections:    Talks on phone: Not on file    Gets together: Not on file    Attends religious service: Not on file    Active member of club or  organization: Not on file    Attends meetings of clubs or organizations: Not on file    Relationship status: Not on file  . Intimate partner violence:    Fear of current or ex partner: Not on file    Emotionally abused: Not on file    Physically abused: Not on file    Forced sexual activity: Not on file  Other Topics Concern  . Not on file  Social History Narrative  . Not on file   Review of Systems  Constitutional: Negative for fever and weight loss.  HENT: Negative for ear discharge, ear pain, hearing loss and tinnitus.   Eyes: Negative for blurred vision, double vision, photophobia and pain.  Respiratory: Negative for cough and shortness of breath.   Cardiovascular: Negative for chest pain and palpitations.  Gastrointestinal: Negative for abdominal pain, blood in stool, constipation,  diarrhea, heartburn, melena, nausea and vomiting.  Genitourinary: Negative for dysuria, flank pain, frequency, hematuria and urgency.  Musculoskeletal: Negative for falls.  Neurological: Negative for dizziness, loss of consciousness and headaches.  Endo/Heme/Allergies: Negative for environmental allergies.  Psychiatric/Behavioral: Negative for depression, hallucinations, substance abuse and suicidal ideas. The patient is not nervous/anxious and does not have insomnia.    BP 92/64   Pulse 65   Temp 98 F (36.7 C) (Oral)   Resp 14   Ht  (1.6 m)   Wt 114 lb (51.7 kg)   SpO2 99%   BMI 20.19 kg/m   Physical Exam  Constitutional: She is oriented to person, place, and time. She appears well-developed and well-nourished.  HENT:  Head: Normocephalic and atraumatic.  Right Ear: Tympanic membrane, external ear and ear canal normal.  Left Ear: Tympanic membrane, external ear and ear canal normal.  Nose: Nose normal. No mucosal edema.  Mouth/Throat: Uvula is midline, oropharynx is clear and moist and mucous membranes are normal. No oropharyngeal exudate or posterior oropharyngeal erythema.  Eyes: Pupils are equal, round, and reactive to light. Conjunctivae are normal.  Neck: Neck supple. No thyromegaly present.  Cardiovascular: Normal rate, regular rhythm, normal heart sounds and intact distal pulses.  Pulmonary/Chest: Effort normal and breath sounds normal. No respiratory distress. She has no wheezes. She has no rales.  Abdominal: Soft. Bowel sounds are normal. She exhibits no distension and no mass. There is no tenderness. There is no rebound and no guarding.  Lymphadenopathy:    She has no cervical adenopathy.  Neurological: She is alert and oriented to person, place, and time. No cranial nerve deficit.  Skin: Skin is warm and dry. No rash noted.  Vitals reviewed.  Assessment/Plan: Hypothyroidism Has been well-controlled on current regimen for some time. Repeat TSH today.  Medications refilled.   Visit for preventive health examination Depression screen negative. Health Maintenance reviewed -- Mammogram and PAP up-to-date. Preventive schedule discussed and handout given in AVS. Will obtain fasting labs today.     Piedad Climes, PA-C

## 2017-10-17 NOTE — Patient Instructions (Signed)
Please go to the lab for blood work.   Our office will call you with your results unless you have chosen to receive results via MyChart.  If your blood work is normal we will follow-up each year for physicals and as scheduled for chronic medical problems.  If anything is abnormal we will treat accordingly and get you in for a follow-up.  We will place a referral for you to see Dr. Hulan Campbell.    Preventive Care 40-64 Years, Female Preventive care refers to lifestyle choices and visits with your health care provider that can promote health and wellness. What does preventive care include?  A yearly physical exam. This is also called an annual well check.  Dental exams once or twice a year.  Routine eye exams. Ask your health care provider how often you should have your eyes checked.  Personal lifestyle choices, including: ? Daily care of your teeth and gums. ? Regular physical activity. ? Eating a healthy diet. ? Avoiding tobacco and drug use. ? Limiting alcohol use. ? Practicing safe sex. ? Taking low-dose aspirin daily starting at age 53. ? Taking vitamin and mineral supplements as recommended by your health care provider. What happens during an annual well check? The services and screenings done by your health care provider during your annual well check will depend on your age, overall health, lifestyle risk factors, and family history of disease. Counseling Your health care provider may ask you questions about your:  Alcohol use.  Tobacco use.  Drug use.  Emotional well-being.  Home and relationship well-being.  Sexual activity.  Eating habits.  Work and work Statistician.  Method of birth control.  Menstrual cycle.  Pregnancy history.  Screening You may have the following tests or measurements:  Height, weight, and BMI.  Blood pressure.  Lipid and cholesterol levels. These may be checked every 5 years, or more frequently if you are over 37 years  old.  Skin check.  Lung cancer screening. You may have this screening every year starting at age 41 if you have a 30-pack-year history of smoking and currently smoke or have quit within the past 15 years.  Fecal occult blood test (FOBT) of the stool. You may have this test every year starting at age 28.  Flexible sigmoidoscopy or colonoscopy. You may have a sigmoidoscopy every 5 years or a colonoscopy every 10 years starting at age 84.  Hepatitis C blood test.  Hepatitis B blood test.  Sexually transmitted disease (STD) testing.  Diabetes screening. This is done by checking your blood sugar (glucose) after you have not eaten for a while (fasting). You may have this done every 1-3 years.  Mammogram. This may be done every 1-2 years. Talk to your health care provider about when you should start having regular mammograms. This may depend on whether you have a family history of breast cancer.  BRCA-related cancer screening. This may be done if you have a family history of breast, ovarian, tubal, or peritoneal cancers.  Pelvic exam and Pap test. This may be done every 3 years starting at age 34. Starting at age 30, this may be done every 5 years if you have a Pap test in combination with an HPV test.  Bone density scan. This is done to screen for osteoporosis. You may have this scan if you are at high risk for osteoporosis.  Discuss your test results, treatment options, and if necessary, the need for more tests with your health care provider. Vaccines Your  health care provider may recommend certain vaccines, such as:  Influenza vaccine. This is recommended every year.  Tetanus, diphtheria, and acellular pertussis (Tdap, Td) vaccine. You may need a Td booster every 10 years.  Varicella vaccine. You may need this if you have not been vaccinated.  Zoster vaccine. You may need this after age 15.  Measles, mumps, and rubella (MMR) vaccine. You may need at least one dose of MMR if you were  born in 1957 or later. You may also need a second dose.  Pneumococcal 13-valent conjugate (PCV13) vaccine. You may need this if you have certain conditions and were not previously vaccinated.  Pneumococcal polysaccharide (PPSV23) vaccine. You may need one or two doses if you smoke cigarettes or if you have certain conditions.  Meningococcal vaccine. You may need this if you have certain conditions.  Hepatitis A vaccine. You may need this if you have certain conditions or if you travel or work in places where you may be exposed to hepatitis A.  Hepatitis B vaccine. You may need this if you have certain conditions or if you travel or work in places where you may be exposed to hepatitis B.  Haemophilus influenzae type b (Hib) vaccine. You may need this if you have certain conditions.  Talk to your health care provider about which screenings and vaccines you need and how often you need them. This information is not intended to replace advice given to you by your health care provider. Make sure you discuss any questions you have with your health care provider. Document Released: 06/11/2015 Document Revised: 02/02/2016 Document Reviewed: 03/16/2015 Elsevier Interactive Patient Education  [employer].

## 2017-10-17 NOTE — Assessment & Plan Note (Signed)
Depression screen negative. Health Maintenance reviewed -- Mammogram and PAP up-to-date. Preventive schedule discussed and handout given in AVS. Will obtain fasting labs today.

## 2017-10-17 NOTE — Assessment & Plan Note (Signed)
Has been well-controlled on current regimen for some time. Repeat TSH today. Medications refilled.

## 2017-10-18 ENCOUNTER — Other Ambulatory Visit: Payer: Self-pay | Admitting: Physician Assistant

## 2017-10-18 DIAGNOSIS — E039 Hypothyroidism, unspecified: Secondary | ICD-10-CM

## 2017-10-18 MED ORDER — LEVOTHYROXINE SODIUM 137 MCG PO TABS: 137.0000 ug | ORAL_TABLET | Freq: Every day | ORAL | 1 refills | Status: DC

## 2017-10-18 MED FILL — SYNTHROID 137 MCG TABLET: 137 | 30 days supply | Qty: 30 | Fill #0

## 2017-10-19 ENCOUNTER — Other Ambulatory Visit: Payer: Self-pay | Admitting: Physician Assistant

## 2017-10-19 DIAGNOSIS — E039 Hypothyroidism, unspecified: Secondary | ICD-10-CM

## 2017-10-26 ENCOUNTER — Encounter: Payer: Self-pay | Admitting: Emergency Medicine

## 2017-11-19 MED FILL — SYNTHROID 137 MCG TABLET: 137 | 30 days supply | Qty: 30 | Fill #1

## 2017-12-18 ENCOUNTER — Encounter: Payer: No Typology Code available for payment source | Admitting: Obstetrics & Gynecology

## 2017-12-24 ENCOUNTER — Encounter: Payer: Self-pay | Admitting: Emergency Medicine

## 2017-12-24 NOTE — Progress Notes (Signed)
Called patient left message on VM advising patient is due for repeat thyroid to ensure levels are improving. She can schedule at our location or walk in at the [place] location. Order placed in chart. [facility] for an [facility] appointment

## 2017-12-27 ENCOUNTER — Telehealth: Payer: Self-pay | Admitting: General Practice

## 2017-12-27 NOTE — Telephone Encounter (Signed)
-----   Message from Waldon MerlWilliam C Martin, PA-C sent at 12/24/2017 12:02 PM EDT ----- Patient overdue for repeat TSH. Please call her and remind her to schedule lab appointment.   ----- Message ----- From: SYSTEM Sent: 12/24/2017  12:07 AM To: Waldon MerlWilliam C Martin, PA-C

## 2017-12-27 NOTE — Telephone Encounter (Signed)
Called pt and LMOVM to remind to call the office and schedule repeat TSH labs.   [facility] / Schedule patient.

## 2017-12-27 NOTE — Progress Notes (Signed)
Called pt and LMOVM to return call and schedule lab only appointment to have TSH repeated.

## 2017-12-28 ENCOUNTER — Other Ambulatory Visit (INDEPENDENT_AMBULATORY_CARE_PROVIDER_SITE_OTHER): Payer: No Typology Code available for payment source

## 2017-12-28 DIAGNOSIS — E039 Hypothyroidism, unspecified: Secondary | ICD-10-CM | POA: Diagnosis not present

## 2017-12-28 LAB — TSH: TSH: 0.01 u[IU]/mL — AB (ref 0.35–4.50)

## 2017-12-31 ENCOUNTER — Other Ambulatory Visit: Payer: Self-pay

## 2017-12-31 DIAGNOSIS — R7989 Other specified abnormal findings of blood chemistry: Secondary | ICD-10-CM

## 2017-12-31 MED ORDER — LEVOTHYROXINE SODIUM 125 MCG PO TABS: 125.0000 ug | ORAL_TABLET | Freq: Every day | ORAL | 1 refills | Status: DC

## 2017-12-31 MED FILL — SYNTHROID 125 MCG TABLET: 125 | 30 days supply | Qty: 30 | Fill #0

## 2018-01-09 ENCOUNTER — Ambulatory Visit (INDEPENDENT_AMBULATORY_CARE_PROVIDER_SITE_OTHER): Payer: No Typology Code available for payment source | Admitting: Obstetrics & Gynecology

## 2018-01-09 ENCOUNTER — Encounter: Payer: Self-pay | Admitting: Obstetrics & Gynecology

## 2018-01-09 VITALS — BP 97/49 | HR 73 | Resp 16 | Ht 62.0 in | Wt 112.0 lb

## 2018-01-09 DIAGNOSIS — R300 Dysuria: Secondary | ICD-10-CM

## 2018-01-09 DIAGNOSIS — Z01419 Encounter for gynecological examination (general) (routine) without abnormal findings: Secondary | ICD-10-CM

## 2018-01-09 DIAGNOSIS — Z0000 Encounter for general adult medical examination without abnormal findings: Secondary | ICD-10-CM | POA: Diagnosis not present

## 2018-01-09 DIAGNOSIS — Z800 Family history of malignant neoplasm of digestive organs: Secondary | ICD-10-CM

## 2018-01-09 NOTE — Progress Notes (Signed)
Subjective:    [name] is a 42 y.o. married P3 (14, 438, and 796 yo sons) female who presents for an annual exam. She has has some bladder changes over the last year, lots of possible bladder infection, some symptoms today. The patient is sexually active. GYN screening history: last pap: was normal. The patient wears seatbelts: yes. The patient participates in regular exercise: yes. Has the patient ever been transfused or tattooed?: no. The patient reports that there is not domestic violence in her life.   Menstrual History: OB History    Gravida  5   Para  1   Term  1   Preterm      AB  1   Living  3     SAB  1   TAB      Ectopic      Multiple      Live Births  1           Menarche age: [age] Patient's last menstrual period was 12/31/2017.    The following portions of the patient's history were reviewed and updated as appropriate: allergies, current medications, past family history, past medical history, past social history, past surgical history and problem list.  Review of Systems Pertinent items are noted in HPI.   FH- no breast/gyn cancer, + colon cancer in maternal GM and GF Married for 16 years Periods q month, lasting about 6 days Had a BTL She has had her bladder injured during 2 of her 3 cesarean sections   Objective:    BP (!) 97/49   Pulse 73   Resp 16   Ht 5\' 2"  (1.575 m)   Wt 112 lb (50.8 kg)   LMP 12/31/2017   BMI 20.49 kg/m   General Appearance:    Alert, cooperative, no distress, appears stated age  Head:    Normocephalic, without obvious abnormality, atraumatic  Eyes:    PERRL, conjunctiva/corneas clear, EOM's intact, fundi    benign, both eyes  Ears:    Normal TM's and external ear canals, both ears  Nose:   Nares normal, septum midline, mucosa normal, no drainage    or sinus tenderness  Throat:   Lips, mucosa, and tongue normal; teeth and gums normal  Neck:   Supple, symmetrical, trachea midline, no adenopathy;    thyroid:  no  enlargement/tenderness/nodules; no carotid   bruit or JVD  Back:     Symmetric, no curvature, ROM normal, no CVA tenderness  Lungs:     Clear to auscultation bilaterally, respirations unlabored  Chest Wall:    No tenderness or deformity   Heart:    Regular rate and rhythm, S1 and S2 normal, no murmur, rub   or gallop  Breast Exam:    No tenderness, masses, or nipple abnormality  Abdomen:     Soft, non-tender, bowel sounds active all four quadrants,    no masses, no organomegaly, scar from 3 cesarean sections  Genitalia:    Normal female without lesion, discharge or tenderness, normal size and shape, anteverted, mobile, non-tender, normal adnexal exam      Extremities:   Extremities normal, atraumatic, no cyanosis or edema  Pulses:   2+ and symmetric all extremities  Skin:   Skin color, texture, turgor normal, no rashes or lesions  Lymph nodes:   Cervical, supraclavicular, and axillary nodes normal  Neurologic:   CNII-XII intact, normal strength, sensation and reflexes    throughout  .    Assessment:  Healthy female exam.    Plan:     Mammogram. Urinalysis. Urine culture and sensitivity.   GI referral for FH of colon cancer

## 2018-01-10 ENCOUNTER — Other Ambulatory Visit: Payer: Self-pay | Admitting: Obstetrics & Gynecology

## 2018-01-10 MED ORDER — SULFAMETHOXAZOLE-TRIMETHOPRIM 800-160 MG PO TABS: 1.0000 | ORAL_TABLET | Freq: Two times a day (BID) | ORAL | 3 refills | Status: DC

## 2018-01-10 MED FILL — SULFAMETHOXAZOLE-TMP DS TAB: 800-160 | 7 days supply | Qty: 14 | Fill #0

## 2018-01-10 MED FILL — LIOTHYRONINE SODIUM 25 MCG: 25 | 90 days supply | Qty: 45 | Fill #1

## 2018-01-10 NOTE — Progress Notes (Signed)
Bactrim DS called in for UTI, awaiting culture results, patient notified via mychart

## 2018-01-11 LAB — URINALYSIS
Bilirubin Urine: NEGATIVE
GLUCOSE, UA: NEGATIVE
HGB URINE DIPSTICK: NEGATIVE
NITRITE: POSITIVE — AB
PH: 7 (ref 5.0–8.0)
Protein, ur: NEGATIVE
SPECIFIC GRAVITY, URINE: 1.023 (ref 1.001–1.03)

## 2018-01-11 LAB — URINE CULTURE
MICRO NUMBER: 90965539
SPECIMEN QUALITY:: ADEQUATE

## 2018-01-14 ENCOUNTER — Other Ambulatory Visit: Payer: Self-pay | Admitting: Obstetrics & Gynecology

## 2018-01-14 MED ORDER — CIPROFLOXACIN HCL 500 MG PO TABS: 500.0000 mg | ORAL_TABLET | Freq: Two times a day (BID) | ORAL | 0 refills | Status: DC

## 2018-01-14 MED FILL — CIPROFLOXACIN HCL 500 MG TA: 500 | 7 days supply | Qty: 14 | Fill #0

## 2018-01-14 NOTE — Progress Notes (Signed)
cipro prescribed for Klebsiella UTI. Patient notified via Mychart.

## 2018-01-14 NOTE — Addendum Note (Signed)
Addended by: Granville LewisLARK, Seren Chaloux L on: 01/14/2018 08:26 AM   Modules accepted: Orders

## 2018-01-31 ENCOUNTER — Encounter: Payer: Self-pay | Admitting: Physician Assistant

## 2018-01-31 ENCOUNTER — Other Ambulatory Visit (INDEPENDENT_AMBULATORY_CARE_PROVIDER_SITE_OTHER): Payer: No Typology Code available for payment source

## 2018-01-31 DIAGNOSIS — R7989 Other specified abnormal findings of blood chemistry: Secondary | ICD-10-CM | POA: Diagnosis not present

## 2018-01-31 LAB — TSH: TSH: 0.23 u[IU]/mL — ABNORMAL LOW (ref 0.35–4.50)

## 2018-02-01 ENCOUNTER — Other Ambulatory Visit: Payer: Self-pay | Admitting: Physician Assistant

## 2018-02-01 MED ORDER — LEVOTHYROXINE SODIUM 112 MCG PO TABS: 112.0000 ug | ORAL_TABLET | Freq: Every day | ORAL | 3 refills | Status: DC

## 2018-02-01 MED FILL — LEVOTHYROXINE 112 MCG TAB: 112 | 30 days supply | Qty: 30 | Fill #0

## 2018-03-11 MED FILL — LEVOTHYROXINE 112 MCG TAB: 112 | 30 days supply | Qty: 30 | Fill #1

## 2018-03-19 ENCOUNTER — Encounter: Payer: Self-pay | Admitting: Physician Assistant

## 2018-03-19 ENCOUNTER — Ambulatory Visit (INDEPENDENT_AMBULATORY_CARE_PROVIDER_SITE_OTHER): Payer: No Typology Code available for payment source | Admitting: General Practice

## 2018-03-19 ENCOUNTER — Other Ambulatory Visit: Payer: Self-pay | Admitting: Physician Assistant

## 2018-03-19 DIAGNOSIS — R350 Frequency of micturition: Secondary | ICD-10-CM

## 2018-03-19 DIAGNOSIS — R300 Dysuria: Secondary | ICD-10-CM

## 2018-03-19 DIAGNOSIS — E039 Hypothyroidism, unspecified: Secondary | ICD-10-CM

## 2018-03-19 LAB — POCT URINALYSIS DIP (DEVICE)
GLUCOSE, UA: 100 mg/dL — AB
Glucose, UA: 250 mg/dL — AB
NITRITE: POSITIVE — AB
Nitrite: POSITIVE — AB
PH: 5 (ref 5.0–8.0)
PROTEIN: 100 mg/dL — AB
Protein, ur: 100 mg/dL — AB
SPECIFIC GRAVITY, URINE: 1.01 (ref 1.005–1.030)
Specific Gravity, Urine: <=1.005 (ref 1.005–1.030)
UROBILINOGEN UA: 4 mg/dL — AB (ref 0.0–1.0)
UROBILINOGEN UA: 4 mg/dL — AB (ref 0.0–1.0)
pH: 5 (ref 5.0–8.0)

## 2018-03-19 MED ORDER — SULFAMETHOXAZOLE-TRIMETHOPRIM 800-160 MG PO TABS: 1.0000 | ORAL_TABLET | Freq: Two times a day (BID) | ORAL | 0 refills | Status: AC

## 2018-03-19 MED FILL — SULFAMETHOXAZOLE-TMP DS TAB: 800-160 | 3 days supply | Qty: 6 | Fill #0

## 2018-03-19 NOTE — Progress Notes (Signed)
Patient presents to office today reporting increased frequency & dysuria since Sunday night. Patient reports recent UTI in August & has been taking Azo at home. UA reveals likely UTI, will send urine for culture and Bactrim per protocol in the meantime. Patient informed of medication at pharmacy & plan of care. Patient had no questions.  Marylynn Pearson RN BSN

## 2018-03-20 LAB — URINE CULTURE

## 2018-03-22 ENCOUNTER — Other Ambulatory Visit: Payer: No Typology Code available for payment source

## 2018-03-25 NOTE — Progress Notes (Signed)
I have reviewed this chart and agree with the RN/CMA assessment and management.    Sarha Bartelt C Jasma Seevers, MD, FACOG Attending Physician, [facility]  

## 2018-03-26 ENCOUNTER — Other Ambulatory Visit: Payer: No Typology Code available for payment source

## 2018-03-26 ENCOUNTER — Other Ambulatory Visit (INDEPENDENT_AMBULATORY_CARE_PROVIDER_SITE_OTHER): Payer: No Typology Code available for payment source

## 2018-03-26 DIAGNOSIS — E039 Hypothyroidism, unspecified: Secondary | ICD-10-CM

## 2018-03-26 LAB — TSH: TSH: 0.87 u[IU]/mL (ref 0.35–4.50)

## 2018-03-26 LAB — T4, FREE: FREE T4: 1.09 ng/dL (ref 0.60–1.60)

## 2018-03-26 LAB — T3, FREE: T3, Free: 2.9 pg/mL (ref 2.3–4.2)

## 2018-03-27 ENCOUNTER — Encounter: Payer: Self-pay | Admitting: Physician Assistant

## 2018-04-15 MED FILL — LEVOTHYROXINE 112 MCG TAB: 112 | 30 days supply | Qty: 30 | Fill #2

## 2018-04-22 ENCOUNTER — Other Ambulatory Visit: Payer: Self-pay | Admitting: General Practice

## 2018-04-22 ENCOUNTER — Other Ambulatory Visit: Payer: Self-pay | Admitting: *Deleted

## 2018-04-22 DIAGNOSIS — N390 Urinary tract infection, site not specified: Secondary | ICD-10-CM

## 2018-05-03 ENCOUNTER — Other Ambulatory Visit: Payer: Self-pay | Admitting: Obstetrics & Gynecology

## 2018-05-03 ENCOUNTER — Telehealth: Payer: Self-pay | Admitting: *Deleted

## 2018-05-03 DIAGNOSIS — Q51818 Other congenital malformations of uterus: Secondary | ICD-10-CM

## 2018-05-03 NOTE — Progress Notes (Signed)
Pt has mullerian abnormality and recurrent UTI.  Needs Renal US (d/w Dr. Retta Dionesahlstedt) before urology appt.

## 2018-05-03 NOTE — Telephone Encounter (Signed)
Attempted to call pt RE: Renal U/S but the voicemail said that the voicemail had not been set up.

## 2018-05-10 ENCOUNTER — Ambulatory Visit (HOSPITAL_COMMUNITY)
Admission: RE | Admit: 2018-05-10 | Discharge: 2018-05-10 | Disposition: A | Discharge status: Home / Self Care | Payer: No Typology Code available for payment source | Source: Ambulatory Visit | Attending: Obstetrics & Gynecology | Admitting: Obstetrics & Gynecology

## 2018-05-10 DIAGNOSIS — Q51818 Other congenital malformations of uterus: Secondary | ICD-10-CM | POA: Insufficient documentation

## 2018-05-27 MED FILL — LEVOTHYROXINE 112 MCG TAB: 112 | 30 days supply | Qty: 30 | Fill #3

## 2018-07-05 ENCOUNTER — Encounter: Payer: Self-pay | Admitting: Emergency Medicine

## 2018-07-05 ENCOUNTER — Other Ambulatory Visit: Payer: Self-pay | Admitting: Physician Assistant

## 2018-07-05 MED FILL — LEVOTHYROXINE 112 MCG TAB: 112 | 30 days supply | Qty: 30 | Fill #0

## 2018-07-08 ENCOUNTER — Other Ambulatory Visit: Payer: Self-pay

## 2018-07-08 ENCOUNTER — Ambulatory Visit (INDEPENDENT_AMBULATORY_CARE_PROVIDER_SITE_OTHER): Payer: No Typology Code available for payment source | Admitting: Physician Assistant

## 2018-07-08 ENCOUNTER — Encounter: Payer: Self-pay | Admitting: Physician Assistant

## 2018-07-08 VITALS — BP 92/60 | HR 55 | Temp 98.1°F | Resp 14 | Ht 62.0 in | Wt 114.0 lb

## 2018-07-08 DIAGNOSIS — E039 Hypothyroidism, unspecified: Secondary | ICD-10-CM | POA: Diagnosis not present

## 2018-07-08 LAB — TSH: TSH: 4.58 u[IU]/mL — ABNORMAL HIGH (ref 0.35–4.50)

## 2018-07-08 NOTE — Progress Notes (Signed)
Established Patient Office Visit  Subjective:  Patient ID: [name], female    DOB: [birth year]  Age: 42 y.o. MRN: [id number]  CC:  Chief Complaint  Patient presents with  . Hypothyroidism    HPI [name] presents for follow-up of hypothyroidism. Patient is currently on a regimen of levothyroxine 112 mcg. Endorses taking as directed. Pt denies changes to skin or hair, constipation, diarrhea, palpitations, syncope, changes to mood. Denies decrease in energy, no vision changes. Denies acute concerns today. Is overdue for repeat TSH.  Past Medical History:  Diagnosis Date  . Heart murmur   . History of chickenpox   . Thyroid disease   . Uterus didelphus     Past Surgical History:  Procedure Laterality Date  . CESAREAN SECTION  02/23/2011   Procedure: CESAREAN SECTION;  Surgeon: Zelphia Cairo;  Location: [facility];  Service: Gynecology;  Laterality: N/A;  . thyroid radiation  2007/2008  . TONSILLECTOMY AND ADENOIDECTOMY  1982    Family History  Problem Relation Age of Onset  . Alcohol abuse Mother   . Cancer Mother        Skin  . Depression Mother   . Arthritis Father   . Hypertension Sister   . Cancer Maternal Grandmother 60       Colon  . Hyperlipidemia Maternal Grandmother   . Hypertension Maternal Grandmother   . Stroke Maternal Grandmother   . Cancer Maternal Grandfather 37       Colon, Lung  . COPD Maternal Grandfather   . Stroke Maternal Grandfather   . Arthritis Paternal Grandmother   . Alcohol abuse Paternal Grandfather   . Arthritis Paternal Grandfather     Social History   Socioeconomic History  . Marital status: Married    Spouse name: Not on file  . Number of children: 3  . Years of education: Not on file  . Highest education level: Not on file  Occupational History  . Not on file  Social Needs  . Financial resource strain: Not on file  . Food insecurity:    Worry: Not on file    Inability: Not on file  . Transportation needs:      Medical: Not on file    Non-medical: Not on file  Tobacco Use  . Smoking status: Never Smoker  . Smokeless tobacco: Never Used  Substance and Sexual Activity  . Alcohol use: Yes    Comment: 2-3 per week on occasion  . Drug use: No  . Sexual activity: Yes    Birth control/protection: Surgical  Lifestyle  . Physical activity:    Days per week: Not on file    Minutes per session: Not on file  . Stress: Not on file  Relationships  . Social connections:    Talks on phone: Not on file    Gets together: Not on file    Attends religious service: Not on file    Active member of club or organization: Not on file    Attends meetings of clubs or organizations: Not on file    Relationship status: Not on file  . Intimate partner violence:    Fear of current or ex partner: Not on file    Emotionally abused: Not on file    Physically abused: Not on file    Forced sexual activity: Not on file  Other Topics Concern  . Not on file  Social History Narrative  . Not on file    Outpatient Medications Prior  to Visit  Medication Sig Dispense Refill  . levothyroxine (SYNTHROID, LEVOTHROID) 112 MCG tablet Take 1 tablet (112 mcg total) by mouth daily. Please schedule an appointment for recheck of thyroid levels 30 tablet 0  . ciprofloxacin (CIPRO) 500 MG tablet Take 1 tablet (500 mg total) by mouth 2 (two) times daily. 14 tablet 0  . IBUPROFEN PO Take by mouth.    . liothyronine (CYTOMEL) 25 MCG tablet Take 0.5 tablets (12.5 mcg total) by mouth daily. 90 tablet 3   No facility-administered medications prior to visit.    No Known Allergies  ROS Review of Systems Pertinent ROS are listed in the HPI.   Objective:    Physical Exam  Constitutional: She is oriented to person, place, and time. She appears well-developed and well-nourished.  HENT:  Head: Normocephalic and atraumatic.  Eyes: Conjunctivae are normal.  Neck: Neck supple.  Cardiovascular: Normal rate, regular rhythm, normal  heart sounds and intact distal pulses.  Pulmonary/Chest: Effort normal and breath sounds normal. No respiratory distress. She has no wheezes. She has no rales. She exhibits no tenderness.  Neurological: She is alert and oriented to person, place, and time.  Psychiatric: She has a normal mood and affect.  Vitals reviewed.  BP 92/60   Pulse (!) 55   Temp 98.1 F (36.7 C) (Oral)   Resp 14   Ht 5\' 2"  (1.575 m)   Wt 51.7 kg   SpO2 98%   BMI 20.85 kg/m  Wt Readings from Last 3 Encounters:  07/08/18 51.7 kg  01/09/18 50.8 kg  10/17/17 51.7 kg     Health Maintenance Due  Topic Date Due  . MAMMOGRAM  11/03/2017    There are no preventive care reminders to display for this patient.  Lab Results  Component Value Date   TSH 0.87 03/26/2018   Lab Results  Component Value Date   WBC 5.7 10/17/2017   HGB 14.1 10/17/2017   HCT 41.0 10/17/2017   MCV 96.6 10/17/2017   PLT 222.0 10/17/2017   Lab Results  Component Value Date   NA 138 10/17/2017   K 4.2 10/17/2017   CO2 27 10/17/2017   GLUCOSE 89 10/17/2017   BUN 11 10/17/2017   CREATININE 0.86 10/17/2017   BILITOT 0.6 10/17/2017   ALKPHOS 59 10/17/2017   AST 12 10/17/2017   ALT 9 10/17/2017   PROT 7.4 10/17/2017   ALBUMIN 4.6 10/17/2017   CALCIUM 9.5 10/17/2017   GFR 76.34 10/17/2017   Lab Results  Component Value Date   CHOL 188 10/17/2017   Lab Results  Component Value Date   HDL 87.60 10/17/2017   Lab Results  Component Value Date   LDLCALC 86 10/17/2017   Lab Results  Component Value Date   TRIG 74.0 10/17/2017   Lab Results  Component Value Date   CHOLHDL 2 10/17/2017   Lab Results  Component Value Date   HGBA1C 5.0 10/17/2017      Assessment & Plan:   Hypothyroidism Doing well. Taking medications as directed. Repeat TSH today. Will alter regimen according to results.   Revanth Neidig E Earleen Aoun, Student-PA

## 2018-07-08 NOTE — Patient Instructions (Signed)
Please go to the lab today for blood work.  I will call you with your results. We will alter treatment regimen(s) if indicated by your results.   I am glad you are doing so well! If thyroid remains in normal range we will just do follow-up every 6 months.

## 2018-07-08 NOTE — Assessment & Plan Note (Signed)
Doing well. Taking medications as directed. Repeat TSH today. Will alter regimen according to results.

## 2018-07-10 ENCOUNTER — Other Ambulatory Visit: Payer: Self-pay | Admitting: Physician Assistant

## 2018-07-10 MED ORDER — LEVOTHYROXINE SODIUM 112 MCG PO TABS: 112.0000 ug | ORAL_TABLET | Freq: Every day | ORAL | 1 refills | Status: DC

## 2018-08-09 MED FILL — LEVOTHYROXINE 112 MCG TAB: 112 | 90 days supply | Qty: 90 | Fill #0

## 2018-11-20 MED FILL — LEVOTHYROXINE 112 MCG TAB: 112 | 90 days supply | Qty: 90 | Fill #1

## 2019-02-04 ENCOUNTER — Telehealth: Payer: Self-pay | Admitting: Physician Assistant

## 2019-02-04 ENCOUNTER — Encounter: Payer: Self-pay | Admitting: Physician Assistant

## 2019-02-04 DIAGNOSIS — M542 Cervicalgia: Secondary | ICD-10-CM

## 2019-02-04 NOTE — Telephone Encounter (Signed)
Pt called and said she wants to get a referral for a chiropractor, Im not sure if she needs an appt for this or not but she said she didn't want one for it and just wanted the referral and just asked me to send a message to provider.

## 2019-02-04 NOTE — Telephone Encounter (Signed)
Sent patient a my chart message advising that she doesn't need an referral from PCP to chiropractor.

## 2019-02-04 NOTE — Telephone Encounter (Signed)
She should be able to schedule an appointment with a local provider. We do not do referrals to chiropractors

## 2019-03-07 ENCOUNTER — Other Ambulatory Visit: Payer: Self-pay | Admitting: Obstetrics & Gynecology

## 2019-03-07 DIAGNOSIS — Z01419 Encounter for gynecological examination (general) (routine) without abnormal findings: Secondary | ICD-10-CM

## 2019-03-13 ENCOUNTER — Other Ambulatory Visit: Payer: Self-pay | Admitting: Physician Assistant

## 2019-03-13 ENCOUNTER — Encounter: Payer: Self-pay | Admitting: Emergency Medicine

## 2019-03-13 ENCOUNTER — Other Ambulatory Visit: Payer: Self-pay | Admitting: Emergency Medicine

## 2019-03-13 DIAGNOSIS — E039 Hypothyroidism, unspecified: Secondary | ICD-10-CM

## 2019-03-13 MED FILL — LEVOTHYROXINE 112 MCG TAB: 112 | 30 days supply | Qty: 30 | Fill #0

## 2019-04-07 ENCOUNTER — Ambulatory Visit (INDEPENDENT_AMBULATORY_CARE_PROVIDER_SITE_OTHER): Payer: No Typology Code available for payment source

## 2019-04-07 ENCOUNTER — Other Ambulatory Visit: Payer: Self-pay

## 2019-04-07 DIAGNOSIS — E039 Hypothyroidism, unspecified: Secondary | ICD-10-CM | POA: Diagnosis not present

## 2019-04-08 LAB — TSH: TSH: 1 u[IU]/mL (ref 0.35–4.50)

## 2019-04-09 MED FILL — LEVOTHYROXINE 112 MCG TAB: 112 | 30 days supply | Qty: 30 | Fill #1

## 2019-05-16 ENCOUNTER — Other Ambulatory Visit: Payer: Self-pay | Admitting: Physician Assistant

## 2019-05-16 MED FILL — LEVOTHYROXINE SODIUM 112 MC: 112 | 90 days supply | Qty: 90 | Fill #0

## 2019-11-24 ENCOUNTER — Encounter: Payer: Self-pay | Admitting: Physician Assistant

## 2019-11-25 NOTE — Telephone Encounter (Signed)
Have not seen patient in > 1 year. She would need at least a video visit to follow-up before additional refills can be given.

## 2020-10-06 IMAGING — US US RENAL
1 series · 15 of 25 positions shown · non-contrast
Comparison: None.

CLINICAL DATA: Malaria and anomaly of uterus, didelphys. Recurrent
UTI

EXAM:
RENAL / URINARY TRACT ULTRASOUND COMPLETE

[Series 1: us renal · 15 of 31 slices shown]
[im 1/31]
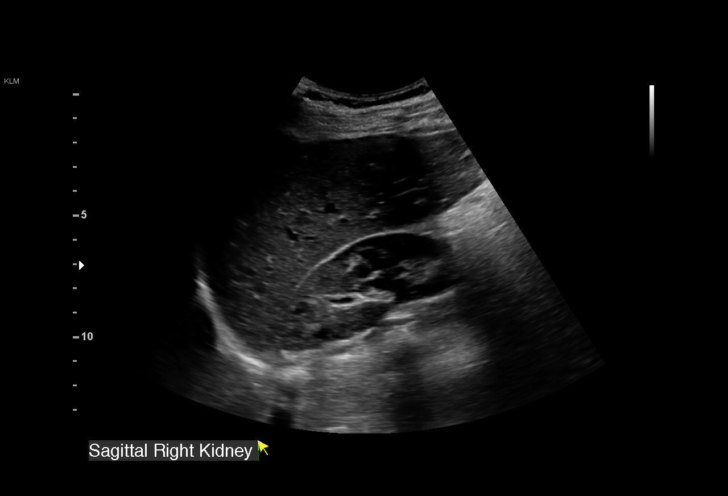
[im 3/31]
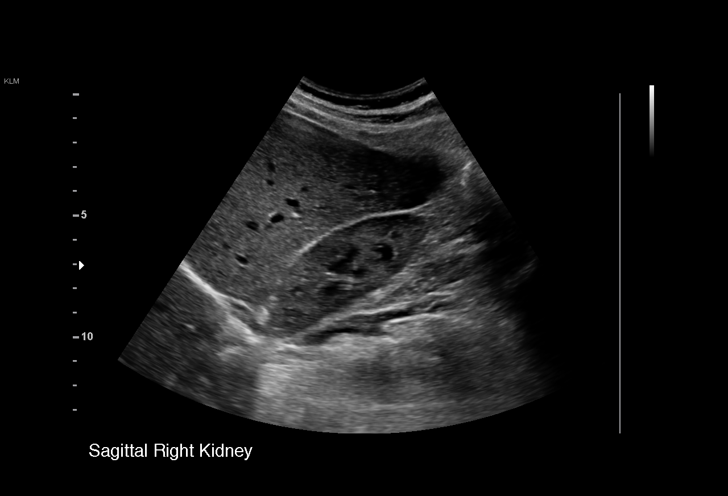
[im 6/31]
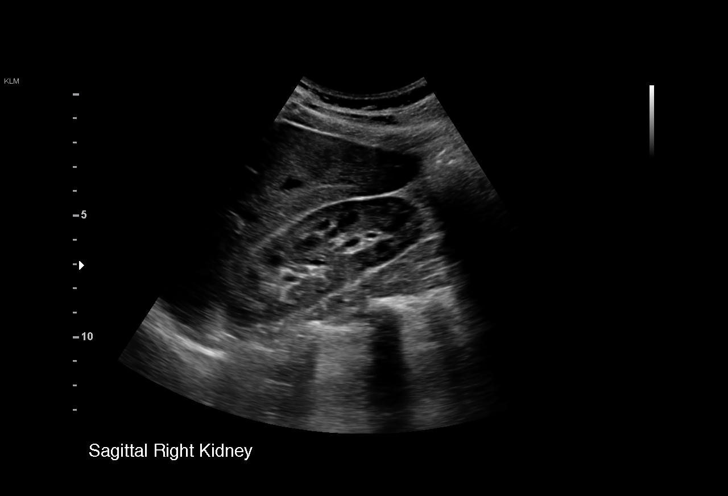
[im 7/31]
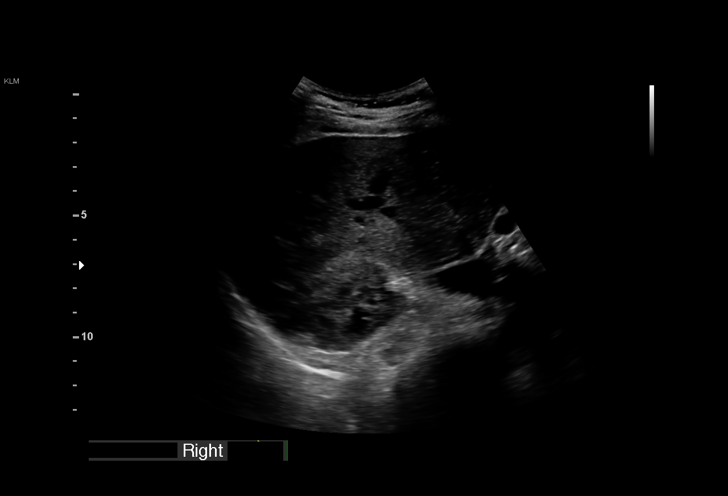
[im 9/31]
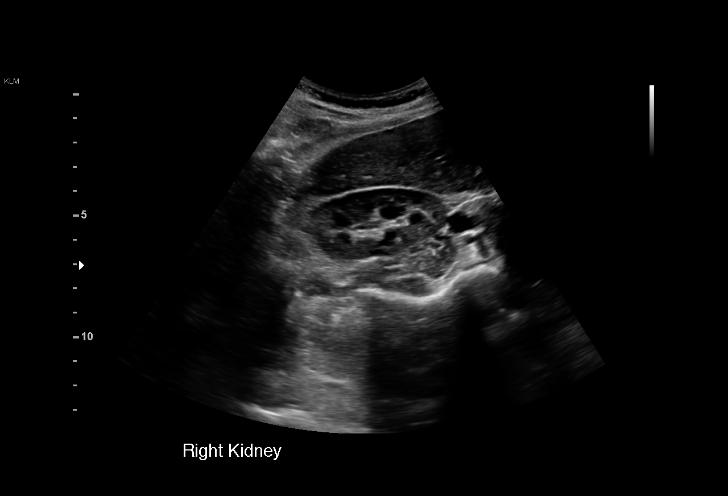
[im 12/31]
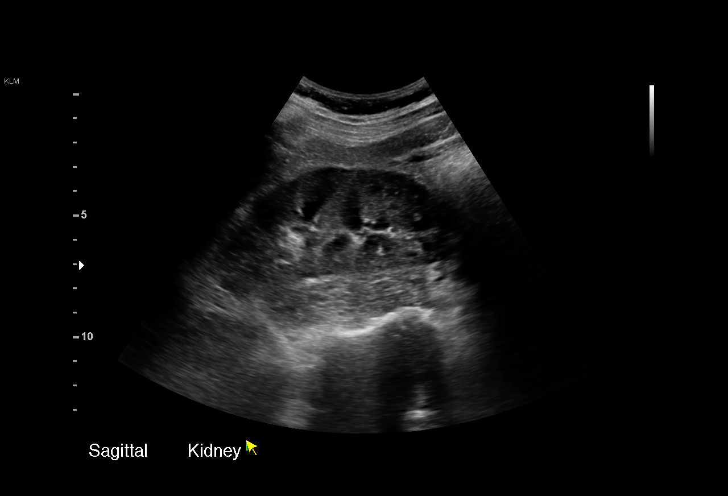
[im 13/31]
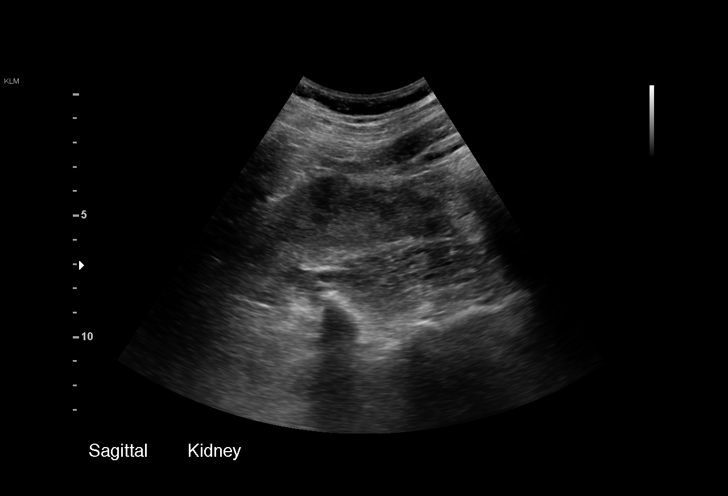
[im 16/31]
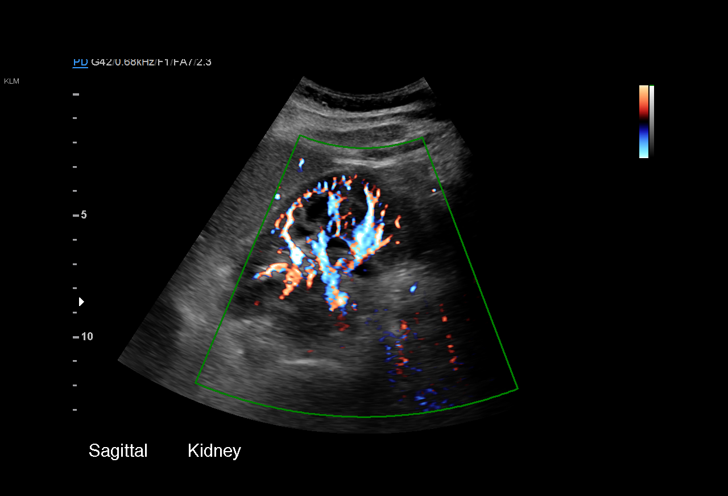
[im 18/31]
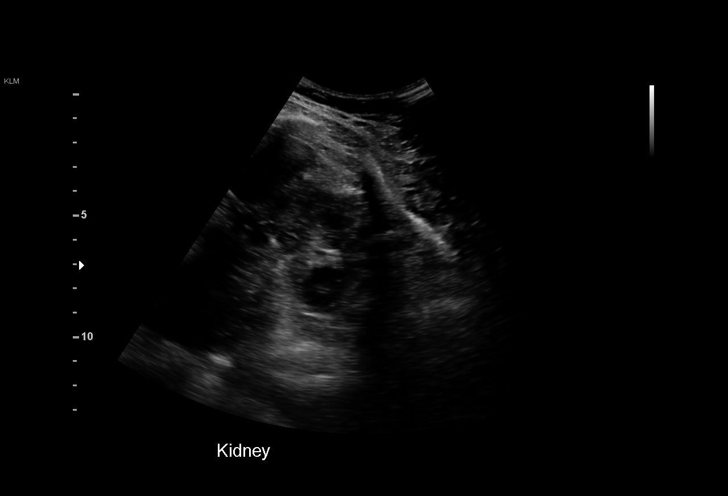
[im 19/31]
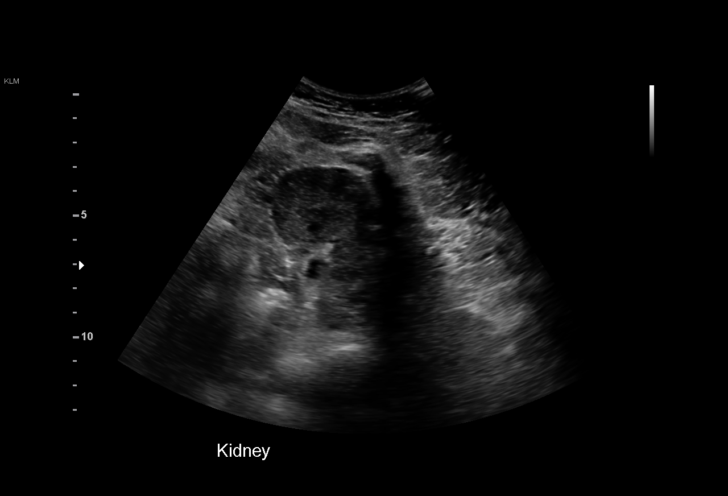
[im 22/31]
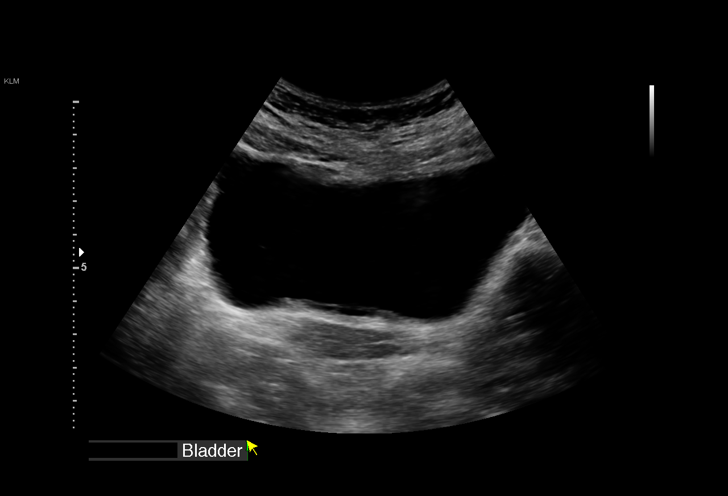
[im 24/31]
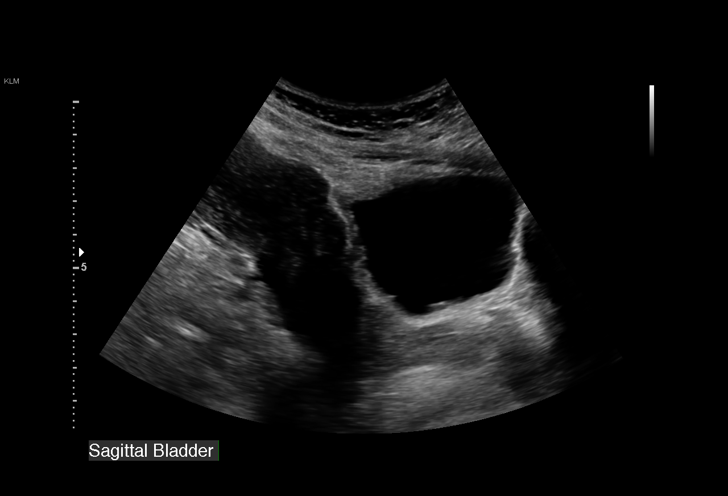
[im 26/31]
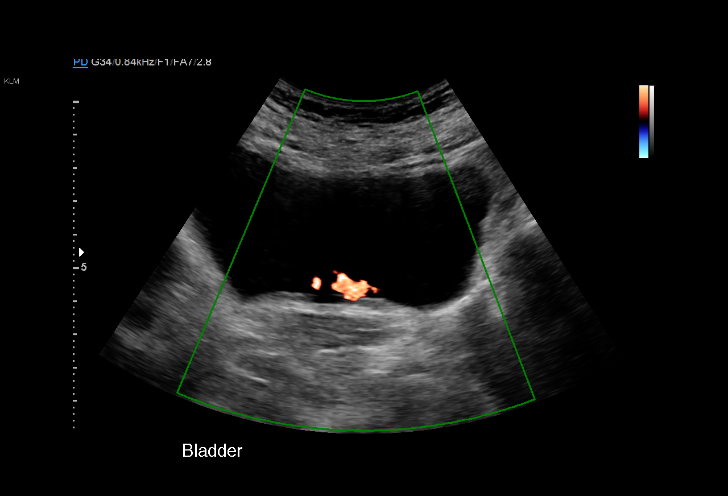
[im 28/31]
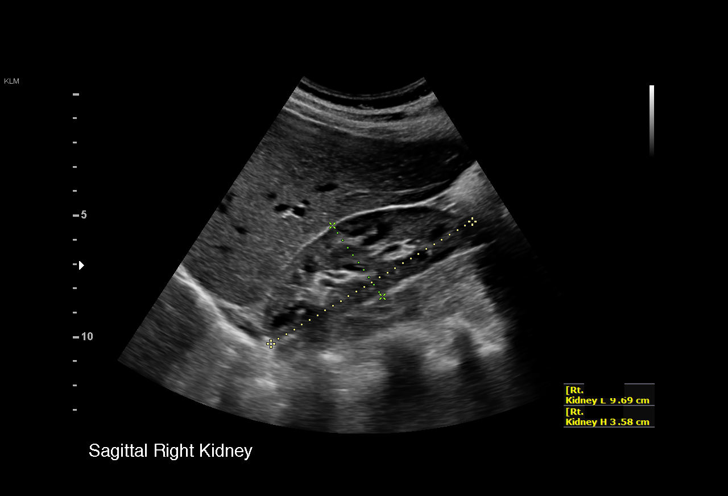
[im 31/31]
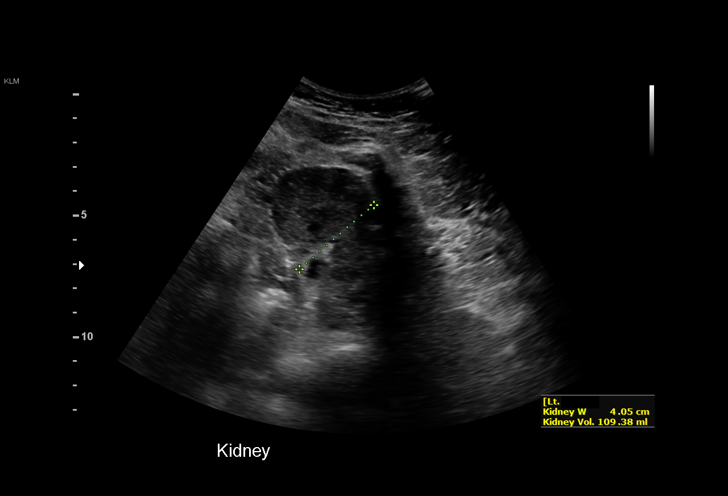

[15 of 25 positions shown; findings below may reference images not displayed]

FINDINGS: Right Kidney:

Renal measurements: 9.7 x 3.6 x 6.6 cm = volume: 120 mL .
Echogenicity within normal limits. No mass or hydronephrosis
visualized.

Left Kidney:

Renal measurements: 10.8 x 4.8 x 4.1 cm = volume: 109 mL.
Echogenicity within normal limits. No mass or hydronephrosis
visualized.

Bladder:

Appears normal for degree of bladder distention.
IMPRESSION: No acute findings.  No hydronephrosis.
